# Patient Record
Sex: Male | Born: 1970 | Race: White | Hispanic: No | Marital: Married | Smoking: Never smoker
Health system: Southern US, Community
[De-identification: ages and names within clinical notes are randomized; demographics above are authoritative.]

## PROBLEM LIST (undated history)

## (undated) DIAGNOSIS — R112 Nausea with vomiting, unspecified: Secondary | ICD-10-CM

## (undated) DIAGNOSIS — M1990 Unspecified osteoarthritis, unspecified site: Secondary | ICD-10-CM

## (undated) DIAGNOSIS — I499 Cardiac arrhythmia, unspecified: Secondary | ICD-10-CM

## (undated) DIAGNOSIS — E785 Hyperlipidemia, unspecified: Secondary | ICD-10-CM

## (undated) DIAGNOSIS — G4730 Sleep apnea, unspecified: Secondary | ICD-10-CM

## (undated) DIAGNOSIS — Z9889 Other specified postprocedural states: Secondary | ICD-10-CM

## (undated) DIAGNOSIS — K649 Unspecified hemorrhoids: Secondary | ICD-10-CM

## (undated) DIAGNOSIS — I10 Essential (primary) hypertension: Secondary | ICD-10-CM

## (undated) DIAGNOSIS — R7989 Other specified abnormal findings of blood chemistry: Secondary | ICD-10-CM

## (undated) DIAGNOSIS — K219 Gastro-esophageal reflux disease without esophagitis: Secondary | ICD-10-CM

## (undated) DIAGNOSIS — M48061 Spinal stenosis, lumbar region without neurogenic claudication: Secondary | ICD-10-CM

## (undated) DIAGNOSIS — R0789 Other chest pain: Secondary | ICD-10-CM

## (undated) HISTORY — PX: BICEPS TENDON REPAIR: SHX566

## (undated) HISTORY — PX: BACK SURGERY: SHX140

## (undated) HISTORY — PX: OTHER SURGICAL HISTORY: SHX169

## (undated) HISTORY — PX: WISDOM TOOTH EXTRACTION: SHX21

## (undated) HISTORY — PX: HERNIA REPAIR: SHX51

## (undated) HISTORY — PX: MASTOIDECTOMY: SUR855

---

## 2001-01-10 ENCOUNTER — Encounter: Payer: Self-pay | Admitting: Neurosurgery

## 2001-01-13 ENCOUNTER — Inpatient Hospital Stay (HOSPITAL_COMMUNITY): Admission: RE | Admit: 2001-01-13 | Discharge: 2001-01-16 | Payer: Self-pay | Admitting: Neurosurgery

## 2001-01-13 ENCOUNTER — Encounter: Payer: Self-pay | Admitting: Neurosurgery

## 2001-03-08 ENCOUNTER — Encounter: Payer: Self-pay | Admitting: Neurosurgery

## 2001-03-08 ENCOUNTER — Ambulatory Visit (HOSPITAL_COMMUNITY): Admission: RE | Admit: 2001-03-08 | Discharge: 2001-03-08 | Payer: Self-pay | Admitting: Neurosurgery

## 2001-04-19 ENCOUNTER — Encounter: Payer: Self-pay | Admitting: Neurosurgery

## 2001-04-19 ENCOUNTER — Ambulatory Visit (HOSPITAL_COMMUNITY): Admission: RE | Admit: 2001-04-19 | Discharge: 2001-04-19 | Payer: Self-pay | Admitting: Neurosurgery

## 2001-07-08 ENCOUNTER — Encounter: Payer: Self-pay | Admitting: Neurosurgery

## 2001-07-08 ENCOUNTER — Encounter: Admission: RE | Admit: 2001-07-08 | Discharge: 2001-07-08 | Payer: Self-pay | Admitting: Neurosurgery

## 2001-12-22 ENCOUNTER — Encounter: Admission: RE | Admit: 2001-12-22 | Discharge: 2001-12-22 | Payer: Self-pay | Admitting: Neurosurgery

## 2001-12-22 ENCOUNTER — Encounter: Payer: Self-pay | Admitting: Neurosurgery

## 2004-10-31 ENCOUNTER — Ambulatory Visit: Payer: Self-pay

## 2005-07-02 ENCOUNTER — Ambulatory Visit: Payer: Self-pay | Admitting: Internal Medicine

## 2006-01-10 ENCOUNTER — Other Ambulatory Visit: Payer: Self-pay

## 2006-01-10 ENCOUNTER — Emergency Department: Payer: Self-pay

## 2006-01-22 ENCOUNTER — Ambulatory Visit: Payer: Self-pay | Admitting: Internal Medicine

## 2006-02-12 ENCOUNTER — Ambulatory Visit: Payer: Self-pay | Admitting: Surgery

## 2006-02-26 ENCOUNTER — Ambulatory Visit: Payer: Self-pay | Admitting: Surgery

## 2008-12-28 ENCOUNTER — Ambulatory Visit: Payer: Self-pay | Admitting: Neurosurgery

## 2010-06-30 ENCOUNTER — Ambulatory Visit: Payer: Self-pay | Admitting: Internal Medicine

## 2010-07-28 ENCOUNTER — Ambulatory Visit: Payer: Self-pay | Admitting: Internal Medicine

## 2015-11-29 ENCOUNTER — Ambulatory Visit: Payer: BC Managed Care – PPO

## 2016-02-28 ENCOUNTER — Ambulatory Visit: Payer: BC Managed Care – PPO | Attending: Specialist

## 2016-02-28 DIAGNOSIS — G4710 Hypersomnia, unspecified: Secondary | ICD-10-CM | POA: Insufficient documentation

## 2016-02-28 DIAGNOSIS — G4761 Periodic limb movement disorder: Secondary | ICD-10-CM | POA: Insufficient documentation

## 2016-02-28 DIAGNOSIS — G4733 Obstructive sleep apnea (adult) (pediatric): Secondary | ICD-10-CM | POA: Insufficient documentation

## 2017-01-20 ENCOUNTER — Other Ambulatory Visit: Payer: Self-pay | Admitting: Student

## 2017-01-20 DIAGNOSIS — M5416 Radiculopathy, lumbar region: Secondary | ICD-10-CM

## 2017-01-28 ENCOUNTER — Ambulatory Visit
Admission: RE | Admit: 2017-01-28 | Discharge: 2017-01-28 | Disposition: A | Discharge status: Home / Self Care | Payer: BC Managed Care – PPO | Source: Ambulatory Visit | Attending: Student | Admitting: Student

## 2017-01-28 DIAGNOSIS — M5416 Radiculopathy, lumbar region: Secondary | ICD-10-CM

## 2017-01-28 MED ORDER — GADOBENATE DIMEGLUMINE 529 MG/ML IV SOLN
20.0000 mL | Freq: Once | INTRAVENOUS | Status: AC | PRN
  Administered 2017-01-28: 20 mL via INTRAVENOUS

## 2017-02-02 ENCOUNTER — Other Ambulatory Visit: Payer: Self-pay | Admitting: Neurosurgery

## 2017-02-17 ENCOUNTER — Inpatient Hospital Stay (HOSPITAL_COMMUNITY): Admission: RE | Admit: 2017-02-17 | Payer: BC Managed Care – PPO | Source: Ambulatory Visit

## 2017-02-24 ENCOUNTER — Encounter (HOSPITAL_COMMUNITY): Admission: RE | Payer: Self-pay | Source: Ambulatory Visit

## 2017-02-24 ENCOUNTER — Inpatient Hospital Stay (HOSPITAL_COMMUNITY): Admission: RE | Admit: 2017-02-24 | Payer: BC Managed Care – PPO | Source: Ambulatory Visit | Admitting: Neurosurgery

## 2017-02-24 SURGERY — POSTERIOR LUMBAR FUSION 1 LEVEL
Anesthesia: General | Site: Back | Laterality: Bilateral

## 2017-03-12 ENCOUNTER — Other Ambulatory Visit: Payer: Self-pay | Admitting: Neurosurgery

## 2017-03-16 NOTE — Pre-Procedure Instructions (Signed)
Andrew Rojas  03/16/2017      [id number] [address], [place] [address] Phone: [phone] Fax: [phone]    Your procedure is scheduled on March 8  Report to [place] at 1100 A.M.  Call this number if you have problems the morning of surgery:  [phone]   Remember:  Do not eat food or drink liquids after midnight.  Take these medicines the morning of surgery with A SIP OF WATER Clomiphene (Clomid), Flonase spray, metoprolol succinate (Toprol-XL)  Stop taking aspirin, BC's, Goody's, Vitamins, Herbal medications, Fish Oil, Aleve, Naproxen,  Ibuprofen, Advil, Motrin    Do not wear jewelry, make-up or nail polish.  Do not wear lotions, powders, or perfumes, or deodorant.  Do not shave 48 hours prior to surgery.  Men may shave face and neck.  Do not bring valuables to the hospital.  [facility] is not responsible for any belongings or valuables.  Contacts, dentures or bridgework may not be worn into surgery.  Leave your suitcase in the car.  After surgery it may be brought to your room.  For patients admitted to the hospital, discharge time will be determined by your treatment team.  Patients discharged the day of surgery will not be allowed to drive home.   Special instructions:   [place]- Preparing For Surgery  Before surgery, you can play an important role. Because skin is not sterile, your skin needs to be as free of germs as possible. You can reduce the number of germs on your skin by washing with CHG (chlorahexidine gluconate) Soap before surgery.  CHG is an antiseptic cleaner which kills germs and bonds with the skin to continue killing germs even after washing.  Please do not use if you have an allergy to CHG or antibacterial soaps. If your skin becomes reddened/irritated stop using the CHG.  Do not shave (including legs and underarms) for at least 48 hours prior to first CHG shower.  It is OK to shave your face.  Please follow these instructions carefully.   1. Shower the NIGHT BEFORE SURGERY and the MORNING OF SURGERY with CHG.   2. If you chose to wash your hair, wash your hair first as usual with your normal shampoo.  3. After you shampoo, rinse your hair and body thoroughly to remove the shampoo.  4. Use CHG as you would any other liquid soap. You can apply CHG directly to the skin and wash gently with a scrungie or a clean washcloth.   5. Apply the CHG Soap to your body ONLY FROM THE NECK DOWN.  Do not use on open wounds or open sores. Avoid contact with your eyes, ears, mouth and genitals (private parts). Wash Face and genitals (private parts)  with your normal soap.  6. Wash thoroughly, paying special attention to the area where your surgery will be performed.  7. Thoroughly rinse your body with warm water from the neck down.  8. DO NOT shower/wash with your normal soap after using and rinsing off the CHG Soap.  9. Pat yourself dry with a CLEAN TOWEL.  10. Wear CLEAN PAJAMAS to bed the night before surgery, wear comfortable clothes the morning of surgery  11. Place CLEAN SHEETS on your bed the night of your first shower and DO NOT SLEEP WITH PETS.    Day of Surgery: Do not apply any deodorants/lotions. Please wear clean clothes to the  hospital/surgery center.      Please read over the following fact sheets that you were given. Pain Booklet, Coughing and Deep Breathing, MRSA Information and Surgical Site Infection Prevention

## 2017-03-17 ENCOUNTER — Encounter (HOSPITAL_COMMUNITY)
Admission: RE | Admit: 2017-03-17 | Discharge: 2017-03-17 | Disposition: A | Discharge status: Home / Self Care | Payer: BC Managed Care – PPO | Source: Ambulatory Visit | Attending: Neurosurgery | Admitting: Neurosurgery

## 2017-03-17 ENCOUNTER — Other Ambulatory Visit: Payer: Self-pay

## 2017-03-17 ENCOUNTER — Encounter (HOSPITAL_COMMUNITY): Payer: Self-pay

## 2017-03-17 HISTORY — DX: Essential (primary) hypertension: I10

## 2017-03-17 HISTORY — DX: Sleep apnea, unspecified: G47.30

## 2017-03-17 HISTORY — DX: Other specified postprocedural states: R11.2

## 2017-03-17 HISTORY — DX: Unspecified osteoarthritis, unspecified site: M19.90

## 2017-03-17 HISTORY — DX: Other specified postprocedural states: Z98.890

## 2017-03-17 LAB — CBC
HEMATOCRIT: 51.9 % (ref 39.0–52.0)
Hemoglobin: 18.3 g/dL — ABNORMAL HIGH (ref 13.0–17.0)
MCH: 32.9 pg (ref 26.0–34.0)
MCHC: 35.3 g/dL (ref 30.0–36.0)
MCV: 93.3 fL (ref 78.0–100.0)
PLATELETS: 204 10*3/uL (ref 150–400)
RBC: 5.56 MIL/uL (ref 4.22–5.81)
RDW: 13.5 % (ref 11.5–15.5)
WBC: 11.6 10*3/uL — AB (ref 4.0–10.5)

## 2017-03-17 LAB — SURGICAL PCR SCREEN
MRSA, PCR: NEGATIVE
STAPHYLOCOCCUS AUREUS: NEGATIVE

## 2017-03-17 LAB — BASIC METABOLIC PANEL
Anion gap: 13 (ref 5–15)
BUN: 9 mg/dL (ref 6–20)
CO2: 22 mmol/L (ref 22–32)
CREATININE: 1.08 mg/dL (ref 0.61–1.24)
Calcium: 9.3 mg/dL (ref 8.9–10.3)
Chloride: 106 mmol/L (ref 101–111)
GFR calc Af Amer: >60 mL/min (ref >60)
GLUCOSE: 91 mg/dL (ref 65–99)
Potassium: 4 mmol/L (ref 3.5–5.1)
SODIUM: 141 mmol/L (ref 135–145)

## 2017-03-17 LAB — TYPE AND SCREEN
ABO/RH(D): O POS
ANTIBODY SCREEN: NEGATIVE

## 2017-03-17 LAB — ABO/RH: ABO/RH(D): O POS

## 2017-03-17 NOTE — Progress Notes (Addendum)
PCP - [place] Reek Cardiologist - denies  Chest x-ray - not needed EKG - requesting Stress Test - > 5 years ECHO - denies Cardiac Cath - deneis  Sleep study 03/12/16 Wears at night  Anesthesia review: yes requested EKG  Patient denies shortness of breath, fever, cough and chest pain at PAT appointment   Patient verbalized understanding of instructions that were given to them at the PAT appointment. Patient was also instructed that they will need to review over the PAT instructions again at home before surgery.

## 2017-03-18 MED ORDER — DEXTROSE 5 % IV SOLN
3.0000 g | INTRAVENOUS | Status: AC
  Administered 2017-03-19: 3 g via INTRAVENOUS
  Filled 2017-03-18: qty 3

## 2017-03-19 ENCOUNTER — Encounter (HOSPITAL_COMMUNITY): Payer: Self-pay

## 2017-03-19 ENCOUNTER — Inpatient Hospital Stay (HOSPITAL_COMMUNITY): Payer: BC Managed Care – PPO

## 2017-03-19 ENCOUNTER — Inpatient Hospital Stay (HOSPITAL_COMMUNITY): Payer: BC Managed Care – PPO | Admitting: Anesthesiology

## 2017-03-19 ENCOUNTER — Inpatient Hospital Stay (HOSPITAL_COMMUNITY): Payer: BC Managed Care – PPO | Admitting: Vascular Surgery

## 2017-03-19 ENCOUNTER — Other Ambulatory Visit: Payer: Self-pay

## 2017-03-19 ENCOUNTER — Inpatient Hospital Stay (HOSPITAL_COMMUNITY)
Admission: RE | Admit: 2017-03-19 | Discharge: 2017-03-20 | DRG: 455 | Disposition: A | Discharge status: Home / Self Care | Payer: BC Managed Care – PPO | Source: Ambulatory Visit | Attending: Neurosurgery | Admitting: Neurosurgery

## 2017-03-19 ENCOUNTER — Inpatient Hospital Stay (HOSPITAL_COMMUNITY)
Admission: RE | Disposition: A | Discharge status: Home / Self Care | Payer: Self-pay | Source: Ambulatory Visit | Attending: Neurosurgery

## 2017-03-19 DIAGNOSIS — Z79899 Other long term (current) drug therapy: Secondary | ICD-10-CM

## 2017-03-19 DIAGNOSIS — I10 Essential (primary) hypertension: Secondary | ICD-10-CM | POA: Diagnosis present

## 2017-03-19 DIAGNOSIS — Z87891 Personal history of nicotine dependence: Secondary | ICD-10-CM

## 2017-03-19 DIAGNOSIS — M5126 Other intervertebral disc displacement, lumbar region: Secondary | ICD-10-CM | POA: Diagnosis present

## 2017-03-19 DIAGNOSIS — G4730 Sleep apnea, unspecified: Secondary | ICD-10-CM | POA: Diagnosis present

## 2017-03-19 DIAGNOSIS — Z7951 Long term (current) use of inhaled steroids: Secondary | ICD-10-CM

## 2017-03-19 DIAGNOSIS — Z419 Encounter for procedure for purposes other than remedying health state, unspecified: Secondary | ICD-10-CM

## 2017-03-19 HISTORY — DX: Spinal stenosis, lumbar region without neurogenic claudication: M48.061

## 2017-03-19 LAB — POCT I-STAT 4, (NA,K, GLUC, HGB,HCT)
Glucose, Bld: 121 mg/dL — ABNORMAL HIGH (ref 65–99)
HCT: 45 % (ref 39.0–52.0)
HEMOGLOBIN: 15.3 g/dL (ref 13.0–17.0)
Potassium: 4.2 mmol/L (ref 3.5–5.1)
SODIUM: 141 mmol/L (ref 135–145)

## 2017-03-19 SURGERY — POSTERIOR LUMBAR FUSION 1 WITH HARDWARE REMOVAL
Anesthesia: General | Site: Spine Lumbar

## 2017-03-19 MED ORDER — SCOPOLAMINE 1 MG/3DAYS TD PT72
MEDICATED_PATCH | TRANSDERMAL | Status: DC | PRN
  Administered 2017-03-19: 1 via TRANSDERMAL

## 2017-03-19 MED ORDER — CLOMIPHENE CITRATE 50 MG PO TABS
50.0000 mg | ORAL_TABLET | Freq: Every day | ORAL | Status: DC
  Filled 2017-03-19: qty 1

## 2017-03-19 MED ORDER — CYCLOBENZAPRINE HCL 10 MG PO TABS: 10.0000 mg | ORAL_TABLET | Freq: Three times a day (TID) | ORAL | Status: DC | PRN

## 2017-03-19 MED ORDER — OXYCODONE HCL 5 MG/5ML PO SOLN: 5.0000 mg | Freq: Once | ORAL | Status: DC | PRN

## 2017-03-19 MED ORDER — 0.9 % SODIUM CHLORIDE (POUR BTL) OPTIME
TOPICAL | Status: DC | PRN
  Administered 2017-03-19: 1000 mL

## 2017-03-19 MED ORDER — LIDOCAINE-EPINEPHRINE 1 %-1:100000 IJ SOLN
INTRAMUSCULAR | Status: AC
  Filled 2017-03-19: qty 1

## 2017-03-19 MED ORDER — NAPROXEN SODIUM 220 MG PO TABS: 440.0000 mg | ORAL_TABLET | Freq: Two times a day (BID) | ORAL | Status: DC | PRN

## 2017-03-19 MED ORDER — BUPIVACAINE LIPOSOME 1.3 % IJ SUSP
20.0000 mL | Freq: Once | INTRAMUSCULAR | Status: DC
  Filled 2017-03-19: qty 20

## 2017-03-19 MED ORDER — IBUPROFEN 200 MG PO TABS: 800.0000 mg | ORAL_TABLET | Freq: Four times a day (QID) | ORAL | Status: DC | PRN

## 2017-03-19 MED ORDER — SODIUM CHLORIDE 0.9 % IV SOLN: 250.0000 mL | INTRAVENOUS | Status: DC

## 2017-03-19 MED ORDER — VANCOMYCIN HCL 1000 MG IV SOLR
INTRAVENOUS | Status: AC
  Filled 2017-03-19: qty 1000

## 2017-03-19 MED ORDER — OXYCODONE HCL 5 MG PO TABS
10.0000 mg | ORAL_TABLET | ORAL | Status: DC | PRN
  Administered 2017-03-19 – 2017-03-20 (×3): 10 mg via ORAL
  Filled 2017-03-19 (×4): qty 2

## 2017-03-19 MED ORDER — FENTANYL CITRATE (PF) 250 MCG/5ML IJ SOLN
INTRAMUSCULAR | Status: AC
  Filled 2017-03-19: qty 5

## 2017-03-19 MED ORDER — THROMBIN 20000 UNITS EX SOLR
CUTANEOUS | Status: AC
  Filled 2017-03-19: qty 20000

## 2017-03-19 MED ORDER — LOSARTAN POTASSIUM 50 MG PO TABS
100.0000 mg | ORAL_TABLET | Freq: Every day | ORAL | Status: DC
  Administered 2017-03-19 – 2017-03-20 (×2): 100 mg via ORAL
  Filled 2017-03-19 (×2): qty 2

## 2017-03-19 MED ORDER — HYDROMORPHONE HCL 1 MG/ML IJ SOLN: 0.2500 mg | INTRAMUSCULAR | Status: DC | PRN

## 2017-03-19 MED ORDER — PANTOPRAZOLE SODIUM 40 MG IV SOLR
40.0000 mg | Freq: Every day | INTRAVENOUS | Status: DC
  Administered 2017-03-19: 40 mg via INTRAVENOUS
  Filled 2017-03-19: qty 40

## 2017-03-19 MED ORDER — ONDANSETRON HCL 4 MG/2ML IJ SOLN: 4.0000 mg | Freq: Four times a day (QID) | INTRAMUSCULAR | Status: DC | PRN

## 2017-03-19 MED ORDER — DEXMEDETOMIDINE HCL IN NACL 200 MCG/50ML IV SOLN
INTRAVENOUS | Status: DC | PRN
  Administered 2017-03-19: 24 ug via INTRAVENOUS

## 2017-03-19 MED ORDER — DEXAMETHASONE SODIUM PHOSPHATE 10 MG/ML IJ SOLN
10.0000 mg | INTRAMUSCULAR | Status: AC
  Administered 2017-03-19: 10 mg via INTRAVENOUS

## 2017-03-19 MED ORDER — LIDOCAINE HCL (CARDIAC) 20 MG/ML IV SOLN
INTRAVENOUS | Status: DC | PRN
  Administered 2017-03-19: 75 mg via INTRAVENOUS

## 2017-03-19 MED ORDER — ROCURONIUM BROMIDE 100 MG/10ML IV SOLN
INTRAVENOUS | Status: DC | PRN
  Administered 2017-03-19 (×4): 50 mg via INTRAVENOUS

## 2017-03-19 MED ORDER — ONDANSETRON HCL 4 MG PO TABS: 4.0000 mg | ORAL_TABLET | Freq: Four times a day (QID) | ORAL | Status: DC | PRN

## 2017-03-19 MED ORDER — OXYCODONE HCL 5 MG PO TABS: 5.0000 mg | ORAL_TABLET | Freq: Once | ORAL | Status: DC | PRN

## 2017-03-19 MED ORDER — PHENOL 1.4 % MT LIQD
1.0000 | OROMUCOSAL | Status: DC | PRN
  Administered 2017-03-19: 1 via OROMUCOSAL
  Filled 2017-03-19: qty 177

## 2017-03-19 MED ORDER — HYDROMORPHONE HCL 1 MG/ML IJ SOLN
1.0000 mg | INTRAMUSCULAR | Status: DC | PRN
Start: 2017-03-19 — End: 2017-03-20

## 2017-03-19 MED ORDER — SCOPOLAMINE 1 MG/3DAYS TD PT72
MEDICATED_PATCH | TRANSDERMAL | Status: AC
  Filled 2017-03-19: qty 1

## 2017-03-19 MED ORDER — ACETAMINOPHEN 325 MG PO TABS: 650.0000 mg | ORAL_TABLET | ORAL | Status: DC | PRN

## 2017-03-19 MED ORDER — LACTATED RINGERS IV SOLN
INTRAVENOUS | Status: DC
  Administered 2017-03-19 (×4): via INTRAVENOUS

## 2017-03-19 MED ORDER — ALBUMIN HUMAN 5 % IV SOLN
INTRAVENOUS | Status: DC | PRN
  Administered 2017-03-19 (×3): via INTRAVENOUS

## 2017-03-19 MED ORDER — PROPOFOL 10 MG/ML IV BOLUS
INTRAVENOUS | Status: DC | PRN
  Administered 2017-03-19: 300 mg via INTRAVENOUS
  Administered 2017-03-19: 40 mg via INTRAVENOUS

## 2017-03-19 MED ORDER — MENTHOL 3 MG MT LOZG: 1.0000 | LOZENGE | OROMUCOSAL | Status: DC | PRN

## 2017-03-19 MED ORDER — ADULT MULTIVITAMIN W/MINERALS CH
1.0000 | ORAL_TABLET | Freq: Every day | ORAL | Status: DC
  Administered 2017-03-19 – 2017-03-20 (×2): 1 via ORAL
  Filled 2017-03-19 (×2): qty 1

## 2017-03-19 MED ORDER — PSEUDOEPHEDRINE-GUAIFENESIN ER 60-600 MG PO TB12: 1.0000 | ORAL_TABLET | Freq: Every day | ORAL | Status: DC | PRN

## 2017-03-19 MED ORDER — CEFAZOLIN SODIUM-DEXTROSE 2-4 GM/100ML-% IV SOLN
2.0000 g | Freq: Three times a day (TID) | INTRAVENOUS | Status: DC
  Administered 2017-03-20: 2 g via INTRAVENOUS
  Filled 2017-03-19 (×4): qty 100

## 2017-03-19 MED ORDER — SODIUM CHLORIDE 0.9% FLUSH: 3.0000 mL | INTRAVENOUS | Status: DC | PRN

## 2017-03-19 MED ORDER — BUPIVACAINE LIPOSOME 1.3 % IJ SUSP
INTRAMUSCULAR | Status: DC | PRN
  Administered 2017-03-19: 20 mL

## 2017-03-19 MED ORDER — SODIUM CHLORIDE 0.9% FLUSH
3.0000 mL | Freq: Two times a day (BID) | INTRAVENOUS | Status: DC
  Administered 2017-03-19: 3 mL via INTRAVENOUS

## 2017-03-19 MED ORDER — SUGAMMADEX SODIUM 500 MG/5ML IV SOLN
INTRAVENOUS | Status: DC | PRN
Start: 2017-03-19 — End: 2017-03-19
  Administered 2017-03-19: 260 mg via INTRAVENOUS

## 2017-03-19 MED ORDER — VANCOMYCIN HCL 1000 MG IV SOLR
INTRAVENOUS | Status: DC | PRN
  Administered 2017-03-19: 1000 mg via TOPICAL

## 2017-03-19 MED ORDER — REWETTING DROPS SOLN: 1.0000 [drp] | Freq: Every day | Status: DC | PRN

## 2017-03-19 MED ORDER — LIDOCAINE-EPINEPHRINE 1 %-1:100000 IJ SOLN
INTRAMUSCULAR | Status: DC | PRN
  Administered 2017-03-19: 10 mL

## 2017-03-19 MED ORDER — FLUTICASONE PROPIONATE 50 MCG/ACT NA SUSP
2.0000 | Freq: Every day | NASAL | Status: DC
  Filled 2017-03-19: qty 16

## 2017-03-19 MED ORDER — CHLORHEXIDINE GLUCONATE CLOTH 2 % EX PADS: 6.0000 | MEDICATED_PAD | Freq: Once | CUTANEOUS | Status: DC

## 2017-03-19 MED ORDER — DM-GUAIFENESIN ER 30-600 MG PO TB12: 1.0000 | ORAL_TABLET | Freq: Every day | ORAL | Status: DC | PRN

## 2017-03-19 MED ORDER — MIDAZOLAM HCL 2 MG/2ML IJ SOLN
INTRAMUSCULAR | Status: AC
  Filled 2017-03-19: qty 2

## 2017-03-19 MED ORDER — THROMBIN (RECOMBINANT) 20000 UNITS EX SOLR
CUTANEOUS | Status: DC | PRN
  Administered 2017-03-19 (×2): 20 mL via TOPICAL

## 2017-03-19 MED ORDER — METOPROLOL SUCCINATE ER 25 MG PO TB24
50.0000 mg | ORAL_TABLET | Freq: Every day | ORAL | Status: DC
  Administered 2017-03-20: 50 mg via ORAL
  Filled 2017-03-19: qty 2

## 2017-03-19 MED ORDER — DEXAMETHASONE SODIUM PHOSPHATE 10 MG/ML IJ SOLN
INTRAMUSCULAR | Status: AC
  Filled 2017-03-19: qty 1

## 2017-03-19 MED ORDER — ALUM & MAG HYDROXIDE-SIMETH 200-200-20 MG/5ML PO SUSP: 30.0000 mL | Freq: Four times a day (QID) | ORAL | Status: DC | PRN

## 2017-03-19 MED ORDER — BACITRACIN 50000 UNITS IM SOLR
INTRAMUSCULAR | Status: DC | PRN
  Administered 2017-03-19: 500 mL

## 2017-03-19 MED ORDER — ONDANSETRON HCL 4 MG/2ML IJ SOLN
INTRAMUSCULAR | Status: DC | PRN
  Administered 2017-03-19: 4 mg via INTRAVENOUS

## 2017-03-19 MED ORDER — PHENYLEPHRINE HCL 0.25 % NA SOLN: 1.0000 | Freq: Four times a day (QID) | NASAL | Status: DC | PRN

## 2017-03-19 MED ORDER — MIDAZOLAM HCL 5 MG/5ML IJ SOLN
INTRAMUSCULAR | Status: DC | PRN
  Administered 2017-03-19: 2 mg via INTRAVENOUS

## 2017-03-19 MED ORDER — THROMBIN 5000 UNITS EX SOLR
CUTANEOUS | Status: AC
  Filled 2017-03-19: qty 5000

## 2017-03-19 MED ORDER — ACETAMINOPHEN 650 MG RE SUPP: 650.0000 mg | RECTAL | Status: DC | PRN

## 2017-03-19 MED ORDER — FENTANYL CITRATE (PF) 100 MCG/2ML IJ SOLN
INTRAMUSCULAR | Status: DC | PRN
  Administered 2017-03-19 (×4): 50 ug via INTRAVENOUS
  Administered 2017-03-19: 150 ug via INTRAVENOUS
  Administered 2017-03-19 (×2): 50 ug via INTRAVENOUS

## 2017-03-19 MED ORDER — EMERGEN-C IMMUNE PO PACK: 1.0000 | PACK | Freq: Every day | ORAL | Status: DC | PRN

## 2017-03-19 SURGICAL SUPPLY — 84 items
ADH SKN CLS APL DERMABOND .7 (GAUZE/BANDAGES/DRESSINGS) ×1
APL SKNCLS STERI-STRIP NONHPOA (GAUZE/BANDAGES/DRESSINGS) ×1
BAG DECANTER FOR FLEXI CONT (MISCELLANEOUS) ×3 IMPLANT
BASKET BONE COLLECTION (BASKET) ×2 IMPLANT
BENZOIN TINCTURE PRP APPL 2/3 (GAUZE/BANDAGES/DRESSINGS) ×3 IMPLANT
BLADE CLIPPER SURG (BLADE) ×2 IMPLANT
BLADE SURG 11 STRL SS (BLADE) ×3 IMPLANT
BONE VIVIGEN FORMABLE 5.4CC (Bone Implant) ×3 IMPLANT
BUR CUTTER 7.0 ROUND (BURR) ×3 IMPLANT
BUR MATCHSTICK NEURO 3.0 LAGG (BURR) ×3 IMPLANT
CANISTER SUCT 3000ML PPV (MISCELLANEOUS) ×3 IMPLANT
CAP LOCKING THREADED (Cap) ×4 IMPLANT
CARTRIDGE OIL MAESTRO DRILL (MISCELLANEOUS) ×1 IMPLANT
CLOSURE WOUND 1/2 X4 (GAUZE/BANDAGES/DRESSINGS) ×1
CONT SPEC 4OZ CLIKSEAL STRL BL (MISCELLANEOUS) ×3 IMPLANT
COVER BACK TABLE 24X17X13 BIG (DRAPES) IMPLANT
COVER BACK TABLE 60X90IN (DRAPES) ×3 IMPLANT
DECANTER SPIKE VIAL GLASS SM (MISCELLANEOUS) ×3 IMPLANT
DERMABOND ADVANCED (GAUZE/BANDAGES/DRESSINGS) ×2
DERMABOND ADVANCED .7 DNX12 (GAUZE/BANDAGES/DRESSINGS) ×1 IMPLANT
DIFFUSER DRILL AIR PNEUMATIC (MISCELLANEOUS) ×3 IMPLANT
DRAPE C-ARM 42X72 X-RAY (DRAPES) ×6 IMPLANT
DRAPE C-ARMOR (DRAPES) IMPLANT
DRAPE HALF SHEET 40X57 (DRAPES) ×2 IMPLANT
DRAPE LAPAROTOMY 100X72X124 (DRAPES) ×3 IMPLANT
DRAPE POUCH INSTRU U-SHP 10X18 (DRAPES) ×3 IMPLANT
DRAPE SURG 17X23 STRL (DRAPES) ×3 IMPLANT
DRSG OPSITE POSTOP 4X8 (GAUZE/BANDAGES/DRESSINGS) ×2 IMPLANT
DURAPREP 26ML APPLICATOR (WOUND CARE) ×3 IMPLANT
ELECT BLADE 4.0 EZ CLEAN MEGAD (MISCELLANEOUS) ×3
ELECT REM PT RETURN 9FT ADLT (ELECTROSURGICAL) ×3
ELECTRODE BLDE 4.0 EZ CLN MEGD (MISCELLANEOUS) IMPLANT
ELECTRODE REM PT RTRN 9FT ADLT (ELECTROSURGICAL) ×1 IMPLANT
EVACUATOR 3/16  PVC DRAIN (DRAIN)
EVACUATOR 3/16 PVC DRAIN (DRAIN) ×1 IMPLANT
GAUZE SPONGE 4X4 12PLY STRL (GAUZE/BANDAGES/DRESSINGS) ×1 IMPLANT
GAUZE SPONGE 4X4 16PLY XRAY LF (GAUZE/BANDAGES/DRESSINGS) ×2 IMPLANT
GLOVE BIO SURGEON STRL SZ7 (GLOVE) ×2 IMPLANT
GLOVE BIO SURGEON STRL SZ8 (GLOVE) ×6 IMPLANT
GLOVE BIOGEL PI IND STRL 6.5 (GLOVE) IMPLANT
GLOVE BIOGEL PI IND STRL 7.0 (GLOVE) IMPLANT
GLOVE BIOGEL PI INDICATOR 6.5 (GLOVE) ×2
GLOVE BIOGEL PI INDICATOR 7.0 (GLOVE) ×4
GLOVE ECLIPSE 7.5 STRL STRAW (GLOVE) IMPLANT
GLOVE INDICATOR 8.5 STRL (GLOVE) ×6 IMPLANT
GLOVE SS BIOGEL STRL SZ 7 (GLOVE) IMPLANT
GLOVE SUPERSENSE BIOGEL SZ 7 (GLOVE) ×2
GLOVE SURG SS PI 6.0 STRL IVOR (GLOVE) ×8 IMPLANT
GOWN STRL REUS W/ TWL LRG LVL3 (GOWN DISPOSABLE) IMPLANT
GOWN STRL REUS W/ TWL XL LVL3 (GOWN DISPOSABLE) ×2 IMPLANT
GOWN STRL REUS W/TWL 2XL LVL3 (GOWN DISPOSABLE) IMPLANT
GOWN STRL REUS W/TWL LRG LVL3 (GOWN DISPOSABLE) ×6
GOWN STRL REUS W/TWL XL LVL3 (GOWN DISPOSABLE) ×6
GRAFT BNE MATRIX VG FRMBL MD 5 (Bone Implant) IMPLANT
KIT BASIN OR (CUSTOM PROCEDURE TRAY) ×3 IMPLANT
KIT ROOM TURNOVER OR (KITS) ×3 IMPLANT
MILL MEDIUM DISP (BLADE) ×3 IMPLANT
NDL HYPO 21X1.5 SAFETY (NEEDLE) ×1 IMPLANT
NDL HYPO 25X1 1.5 SAFETY (NEEDLE) ×1 IMPLANT
NEEDLE HYPO 21X1.5 SAFETY (NEEDLE) ×3 IMPLANT
NEEDLE HYPO 25X1 1.5 SAFETY (NEEDLE) ×3 IMPLANT
NS IRRIG 1000ML POUR BTL (IV SOLUTION) ×3 IMPLANT
OIL CARTRIDGE MAESTRO DRILL (MISCELLANEOUS) ×3
PACK LAMINECTOMY NEURO (CUSTOM PROCEDURE TRAY) ×3 IMPLANT
PAD ARMBOARD 7.5X6 YLW CONV (MISCELLANEOUS) ×13 IMPLANT
PATTIES SURGICAL 1X1 (DISPOSABLE) ×2 IMPLANT
ROD CREO 50MM (Rod) ×4 IMPLANT
SCREW PA THRD CREO TULIP 5.5X4 (Head) ×4 IMPLANT
SCREW SET (Screw) ×4 IMPLANT
SCREW SPINE CREO AMP 6.5X50 (Screw) ×4 IMPLANT
SPACER RISE 10X30 10-17MM-15 (Spacer) ×4 IMPLANT
SPONGE LAP 4X18 X RAY DECT (DISPOSABLE) IMPLANT
SPONGE SURGIFOAM ABS GEL 100 (HEMOSTASIS) ×5 IMPLANT
STRIP CLOSURE SKIN 1/2X4 (GAUZE/BANDAGES/DRESSINGS) ×3 IMPLANT
SUT VIC AB 0 CT1 18XCR BRD8 (SUTURE) ×2 IMPLANT
SUT VIC AB 0 CT1 8-18 (SUTURE) ×6
SUT VIC AB 2-0 CT1 18 (SUTURE) ×3 IMPLANT
SUT VIC AB 4-0 PS2 27 (SUTURE) ×3 IMPLANT
SYR 20CC LL (SYRINGE) ×2 IMPLANT
SYR CONTROL 10ML LL (SYRINGE) ×2 IMPLANT
TOWEL GREEN STERILE (TOWEL DISPOSABLE) ×3 IMPLANT
TOWEL GREEN STERILE FF (TOWEL DISPOSABLE) ×3 IMPLANT
TRAY FOLEY W/METER SILVER 16FR (SET/KITS/TRAYS/PACK) ×3 IMPLANT
WATER STERILE IRR 1000ML POUR (IV SOLUTION) ×3 IMPLANT

## 2017-03-19 NOTE — H&P (Signed)
Andrew Rojas is an 47 y.o. male.   Chief Complaint: Back and bilateral leg pain HPI: A 48 year old gentleman with history of previous L4-5 fusion [place] back and bilateral hip and leg pain radiating and L3 nerve root pattern. Workup revealed segmental degeneration above his previous 45 fusion at L3-4. Due the patient's progression of clinical syndrome imaging findings Letta Median conservative treatment I recommended reinsertion of lumbar fusion removal of hardware and posterior lumbar interbody fusion at L3-4. I've extensively gone over the risks and benefits of that operation with him as well as perioperative course expectations of outcome and alternatives to surgery and he understood and agreed to proceed forward.  Past Medical History:  Diagnosis Date  . Arthritis    back  . Hypertension   . Lumbar stenosis   . PONV (postoperative nausea and vomiting)   . Sleep apnea    wears CPAP    Past Surgical History:  Procedure Laterality Date  . BACK SURGERY     PLIF  . BICEPS TENDON REPAIR Right   . HERNIA REPAIR    . MASTOIDECTOMY    . WISDOM TOOTH EXTRACTION      History reviewed. No pertinent family history. Social History:  reports that  has never smoked. He has quit using smokeless tobacco. He reports that he drinks alcohol. He reports that he does not use drugs.  Allergies: No Known Allergies  Medications Prior to Admission  Medication Sig Dispense Refill  . clomiPHENE (CLOMID) 50 MG tablet Take 50 mg by mouth daily.    . fluticasone (FLONASE) 50 MCG/ACT nasal spray Place 2 sprays into both nostrils daily.  11  . ibuprofen (ADVIL,MOTRIN) 200 MG tablet Take 800 mg by mouth every 6 (six) hours as needed (for pain.).    Marland Kitchen losartan (COZAAR) 100 MG tablet Take 100 mg by mouth daily.  3  . metoprolol succinate (TOPROL-XL) 50 MG 24 hr tablet Take 50 mg by mouth daily.  3  . Multiple Vitamin (MULTIVITAMIN WITH MINERALS) TABS tablet Take 1 tablet by mouth daily. Men's One A Day Multivitamin     . Multiple Vitamins-Minerals (EMERGEN-C IMMUNE) PACK Take 1 packet by mouth daily as needed (for immune health).    . naproxen sodium (ALEVE) 220 MG tablet Take 440 mg by mouth 2 (two) times daily as needed (for back and muscle pain.).    Marland Kitchen Omega-3 Fatty Acids (FISH OIL) 1000 MG CAPS Take 1,000 mg by mouth daily.    Marland Kitchen OVER THE COUNTER MEDICATION Take 1 capsule by mouth daily as needed (prior to workouts.). BCAA (BRANCHED-CHAIN AMINO ACID)    . phenylephrine (NEO-SYNEPHRINE) 0.25 % nasal spray Place 1 spray into both nostrils every 6 (six) hours as needed for congestion.    . pseudoephedrine-guaifenesin (MUCINEX D) 60-600 MG 12 hr tablet Take 1 tablet by mouth daily as needed (sinus issues/congestion.).    Marland Kitchen Soft Lens Products (REWETTING DROPS) SOLN Place 1-2 drops into both eyes daily as needed (for contact lenses.).      Results for orders placed or performed during the hospital encounter of 03/17/17 (from the past 48 hour(s))  Surgical pcr screen     Status: None   Collection Time: 03/17/17  3:39 PM  Result Value Ref Range   MRSA, PCR NEGATIVE NEGATIVE   Staphylococcus aureus NEGATIVE NEGATIVE    Comment: (NOTE) The Xpert SA Assay (FDA approved for NASAL specimens in patients 6 years of age and older), is one component of a comprehensive surveillance program.  It is not intended to diagnose infection nor to guide or monitor treatment. Performed at [facility], [place] [address] [place], [place] [address]   Basic metabolic panel     Status: None   Collection Time: 03/17/17  3:40 PM  Result Value Ref Range   Sodium 141 135 - 145 mmol/L   Potassium 4.0 3.5 - 5.1 mmol/L   Chloride 106 101 - 111 mmol/L   CO2 22 22 - 32 mmol/L   Glucose, Bld 91 65 - 99 mg/dL   BUN 9 6 - 20 mg/dL   Creatinine, Ser 1.08 0.61 - 1.24 mg/dL   Calcium 9.3 8.9 - 10.3 mg/dL   GFR calc non Af Amer >60 >60 mL/min   GFR calc Af Amer >60 >60 mL/min    Comment: (NOTE) The eGFR has been calculated using the  CKD EPI equation. This calculation has not been validated in all clinical situations. eGFR's persistently <60 mL/min signify possible Chronic Kidney Disease.    Anion gap 13 5 - 15    Comment: Performed at [facility] [address], [place], [place] [address]  CBC     Status: Abnormal   Collection Time: 03/17/17  3:40 PM  Result Value Ref Range   WBC 11.6 (H) 4.0 - 10.5 K/uL   RBC 5.56 4.22 - 5.81 MIL/uL   Hemoglobin 18.3 (H) 13.0 - 17.0 g/dL   HCT 51.9 39.0 - 52.0 %   MCV 93.3 78.0 - 100.0 fL   MCH 32.9 26.0 - 34.0 pg   MCHC 35.3 30.0 - 36.0 g/dL   RDW 13.5 11.5 - 15.5 %   Platelets 204 150 - 400 K/uL    Comment: Performed at [facility] [address] [place], [place] [address]  Type and screen All Cardiac and thoracic surgeries, spinal fusions, myomectomies, craniotomies, colon & liver resections, total joint revisions, same day c-section with placenta previa or accreta.     Status: None   Collection Time: 03/17/17  3:45 PM  Result Value Ref Range   ABO/RH(D) O POS    Antibody Screen NEG    Sample Expiration 03/31/2017    Extend sample reason      NO TRANSFUSIONS OR PREGNANCY IN THE PAST 3 MONTHS Performed at [facility], [place] [address], [place], [place] [address]   ABO/Rh     Status: None   Collection Time: 03/17/17  3:45 PM  Result Value Ref Range   ABO/RH(D)      O POS Performed at [facility] [address], [place],  [address]    No results found.  Review of Systems  Musculoskeletal: Positive for back pain and myalgias.  Neurological: Positive for sensory change.    Blood pressure (!) 145/88, pulse 97, temperature 97.7 F (36.5 C), temperature source Oral, resp. rate 20, SpO2 97 %. Physical Exam  Constitutional: He is oriented to person, place, and time. He appears well-developed.  HENT:  Head: Normocephalic.  Eyes: Pupils are equal, round, and reactive to light.  GI: Soft.  Neurological: He is alert and oriented to  person, place, and time. He has normal strength. GCS eye subscore is 4. GCS verbal subscore is 5. GCS motor subscore is 6.  Strength is 5 out of 5 iliopsoas, quads, hamstrings, gastric, and tibialis, and EHL.  Skin: Skin is warm and dry.     Assessment/Plan 46 presents for posterior lumbar interbody fusion L3-4  Janeth Terry P,  MD 03/19/2017, 1:01 PM

## 2017-03-19 NOTE — Op Note (Signed)
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  #2 decompressive lumbar limiting L3-4 and excess and requiring more work to would be needed with a standard interbody fusion with complete medial facetectomies radical foraminotomies of the L3 and L4 nerve roots.  #3 posterior lumbar interbody fusion L3-4 utilizing the globus rise expandable cage system with 10 x 17 expandable 30 mm cages bilaterally cages were packed with locally harvested autograft mixed with vivigen  #4 pedicle screw fixation L3-4 with globus Creole amp modular pedicle screws placed at L3 bilaterally with 6.5 x 50 mm screws tying into the CD Horizon M8 residual screws at L4.  #5 posterior lateral arthrodesis L3-4 utilizing locally harvested autograft mixed with DBX mix  Surgeon: Dominica Severin Roczen Waym[place]  Asst.: Glenford Peers  Anesthesia: Gen.  EBL: Minimal  History of present illness: 47 year old gentleman with a history of L4-5 fusion back in 2003 did very well however however over the last several months to suppress worsening back bilateral leg pain consistent with an L3 and L4 nerve root pattern. Workup revealed segment breakdown L3-4 with a disc herniation instability diastased of the facet joints. Due to patient's progression of clinical syndrome failed conservative treatment imaging findings I recommended decompression stabilization procedure at L3-4 with an exploration of fusion removal of hardware L4-5. I extensively reviewed the risks and benefits of the operation with the patient as well as perioperative course expectations of outcome alternatives of surgery and she understood and agreed to proceed forward  Operative procedure: 47 year old per the brought in the or was induced on general anesthesia positioned prone the Wilson frame his back  spelled prepped and draped in routine sterile fashion old incision was opened up and extended cephalad after infiltration of 10 mL lidocaine with epi a scar tissues dissected free exposing the old hardware at L4-5 the fusion did appear to be solid. I then dissected and exposed the TPS L3. I then disconnected across and disconnected the nuts and rods and removed the rods cross-link and L5 pedicle screws. Then removed the spinous process at L3 performed drill down the facet joints at L3-4 perform central decompression with complete medial facetectomies dissect into the scar tissue inferiorly and unroofing the 4 root distally. Marching superiorly I aggressively under bit the superior tickling facet marching out the L3 nerve root removing a large amount of hypertrophied facet joint and causing marked foraminal stenosis. At the end the foraminotomies all the nerve roots free and clear and unroofed I then worked in the discectomy. Epidural veins, the disc spaces cleanout bilaterally utilizing sequential distraction with an 11 distractor placed a selected 1017 expander cages. After adequate discectomy and endplate preparation I inserted the right-sided cage and expanded proximal 7 turns then removed the distractor posterior fluoroscopy confirmed good position of the implant. Cleaned out the disc both centrally and laterally was several large herniations 1 migrated inferiorly on the left remove this and after adequate endplate preparation I packed the locally harvested autograft mix centrally inserted the contralateral cage and expanded a similar fashion. Then the wound scope was irrigated meticulous in space was maintained all the foraminal reinspected to confirm patency and decompression. I then pedicle was drilled cannulated probed tapped probed and 6.5 x 50 mm screws were inserted bilaterally at L3. Posterior fluoroscopy confirmed good position of the implants was cut was irrigated meticulous hemostasis was maintained  aggressive decortication was care MTPs or lateral gutters lateral facet joints local autograft mix was packed posterior laterally then I simple the heads place the rods anchored everything in place reinspected the foramina to confirm patency blade Gelfoam on the dura injected X Brown the fashion sprinkled vancomycin powder in the wound. Then a large Hemovac drain was placed and the wounds closed in layers with interrupted Vicryl skin was closed running 4 subcuticular Dermabond benzo and Steri-Strips and sterile dressing was applied patient recovered in stable condition. At the end of case all needle counts sponge counts were correct.

## 2017-03-19 NOTE — Transfer of Care (Signed)
Immediate Anesthesia Transfer of Care Note  Patient: Andrew Rojas  Procedure(s) Performed: POSTERIOR LUMBAR INTERBODY FUSION LUMBAR THREE-FOUR (N/A Spine Lumbar)  Patient Location: PACU  Anesthesia Type:General  Level of Consciousness: awake and patient cooperative  Airway & Oxygen Therapy: Patient Spontanous Breathing  Post-op Assessment: Report given to RN and Post -op Vital signs reviewed and stable  Post vital signs: Reviewed and stable  Last Vitals:  Vitals:   03/19/17 1148 03/19/17 1253  BP: (!) 149/90 (!) 145/88  Pulse:    Resp:    Temp:    SpO2:      Last Pain:  Vitals:   03/19/17 1119  TempSrc: Oral      Patients Stated Pain Goal: 3 (11/65/79 0383)  Complications: No apparent anesthesia complications

## 2017-03-19 NOTE — Anesthesia Preprocedure Evaluation (Signed)
Anesthesia Evaluation  Patient identified by MRN, date of birth, ID band Patient awake    Reviewed: Allergy & Precautions, H&P , NPO status , Patient's Chart, lab work & pertinent test results  History of Anesthesia Complications (+) PONV and history of anesthetic complications  Airway Mallampati: II   Neck ROM: full    Dental   Pulmonary sleep apnea ,    breath sounds clear to auscultation       Cardiovascular hypertension,  Rhythm:regular Rate:Normal     Neuro/Psych    GI/Hepatic   Endo/Other    Renal/GU      Musculoskeletal  (+) Arthritis ,   Abdominal   Peds  Hematology   Anesthesia Other Findings   Reproductive/Obstetrics                             Anesthesia Physical Anesthesia Plan  ASA: III  Anesthesia Plan: General   Post-op Pain Management:    Induction: Intravenous  PONV Risk Score and Plan: 3 and Ondansetron, Dexamethasone, Midazolam, Treatment may vary due to age or medical condition and Scopolamine patch - Pre-op  Airway Management Planned: Oral ETT  Additional Equipment:   Intra-op Plan:   Post-operative Plan: Extubation in OR  Informed Consent: I have reviewed the patients History and Physical, chart, labs and discussed the procedure including the risks, benefits and alternatives for the proposed anesthesia with the patient or authorized representative who has indicated his/her understanding and acceptance.     Plan Discussed with: CRNA, Anesthesiologist and Surgeon  Anesthesia Plan Comments:         Anesthesia Quick Evaluation

## 2017-03-19 NOTE — Anesthesia Procedure Notes (Signed)
Procedure Name: Intubation Date/Time: 03/19/2017 1:30 PM Performed by: Lance Coon, CRNA Pre-anesthesia Checklist: Patient identified, Emergency Drugs available, Suction available, Patient being monitored and Timeout performed Patient Re-evaluated:Patient Re-evaluated prior to induction Oxygen Delivery Method: Circle system utilized Preoxygenation: Pre-oxygenation with 100% oxygen Induction Type: IV induction Ventilation: Oral airway inserted - appropriate to patient size and Mask ventilation without difficulty Laryngoscope Size: Miller and 3 Grade View: Grade I Tube type: Oral Number of attempts: 1 Placement Confirmation: ETT inserted through vocal cords under direct vision,  positive ETCO2,  CO2 detector and breath sounds checked- equal and bilateral Secured at: 22 cm Tube secured with: Tape Dental Injury: Teeth and Oropharynx as per pre-operative assessment

## 2017-03-20 MED ORDER — SENNA 8.6 MG PO TABS
1.0000 | ORAL_TABLET | Freq: Every day | ORAL | Status: DC
  Administered 2017-03-20: 8.6 mg via ORAL
  Filled 2017-03-20: qty 1

## 2017-03-20 MED ORDER — PANTOPRAZOLE SODIUM 40 MG PO TBEC: 40.0000 mg | DELAYED_RELEASE_TABLET | Freq: Every day | ORAL | Status: DC

## 2017-03-20 MED ORDER — OXYCODONE-ACETAMINOPHEN 7.5-325 MG PO TABS: 1.0000 | ORAL_TABLET | ORAL | 0 refills | Status: DC | PRN

## 2017-03-20 MED ORDER — DOCUSATE SODIUM 100 MG PO CAPS: 100.0000 mg | ORAL_CAPSULE | Freq: Every day | ORAL | Status: DC | PRN

## 2017-03-20 MED ORDER — CYCLOBENZAPRINE HCL 10 MG PO TABS: 10.0000 mg | ORAL_TABLET | Freq: Three times a day (TID) | ORAL | 2 refills | Status: DC | PRN

## 2017-03-20 NOTE — Progress Notes (Signed)
Pt given discharge instructions, IV and tele removed. Drain removed by Laverda Sorenson, RN. Questions answered regarding dressing changes and follow up appt. Prescriptions given. Wife at bedside. Taken out via wheelchair.

## 2017-03-20 NOTE — Discharge Summary (Signed)
Physician Discharge Summary  Patient ID: Andrew Rojas MRN: [id number] DOB/AGE: 08-09-1970 47 y.o.  Admit date: 03/19/2017 Discharge date: 03/20/2017  Admission Diagnoses:  Lumbar HNP  Discharge Diagnoses:  Same Active Problems:   HNP (herniated nucleus pulposus), lumbar   Discharged Condition: Stable  Hospital Course:  Andrew Rojas is a 47 y.o. male who was admitted for the below procedure. There were no post operative complications. At time of discharge, pain was well controlled, ambulating with Pt/OT, tolerating po, voiding normal. Ready for discharge.  Treatments: Surgery Exploration of fusion removal of hardware L4-5 with removal of bilateral L5 pedicle screws in addition to rods and nuts.  #2 decompressive lumbar limiting L3-4 and excess and requiring more work to would be needed with a standard interbody fusion with complete medial facetectomies radical foraminotomies of the L3 and L4 nerve roots.  #3 posterior lumbar interbody fusion L3-4 utilizing the globus rise expandable cage system with 10 x 17 expandable 30 mm cages bilaterally cages were packed with locally harvested autograft mixed with vivigen  #4 pedicle screw fixation L3-4 with globus Creole amp modular pedicle screws placed at L3 bilaterally with 6.5 x 50 mm screws tying into the CD Horizon M8 residual screws at L4.  #5 posterior lateral arthrodesis L3-4 utilizing locally harvested autograft mixed with DBX mix  Discharge Exam: Blood pressure 134/64, pulse 88, temperature 98.3 F (36.8 C), temperature source Oral, resp. rate 18, SpO2 99 %. Awake, alert, oriented Speech fluent, appropriate CN grossly intact 5/5 BUE/BLE Wound c/d/i  Disposition:   Discharge Instructions    Call MD for:  difficulty breathing, headache or visual disturbances   Complete by:  As directed    Call MD for:  persistant dizziness or light-headedness   Complete by:  As directed    Call MD for:  redness, tenderness, or signs of  infection (pain, swelling, redness, odor or green/yellow discharge around incision site)   Complete by:  As directed    Call MD for:  severe uncontrolled pain   Complete by:  As directed    Call MD for:  temperature >100.4   Complete by:  As directed    Diet general   Complete by:  As directed    Driving Restrictions   Complete by:  As directed    Do not drive until given clearance.   Increase activity slowly   Complete by:  As directed    Lifting restrictions   Complete by:  As directed    Do not lift anything >10lbs. Avoid bending and twisting in awkward positions. Avoid bending at the back.   May shower / Bathe   Complete by:  As directed    In 24 hours. Okay to wash wound with warm soapy water. Avoid scrubbing the wound. Pat dry.     Allergies as of 03/20/2017   No Known Allergies     Medication List    TAKE these medications   ALEVE 220 MG tablet Generic drug:  naproxen sodium Take 440 mg by mouth 2 (two) times daily as needed (for back and muscle pain.).   clomiPHENE 50 MG tablet Commonly known as:  CLOMID Take 50 mg by mouth daily.   cyclobenzaprine 10 MG tablet Commonly known as:  FLEXERIL Take 1 tablet (10 mg total) by mouth 3 (three) times daily as needed for muscle spasms.   EMERGEN-C IMMUNE Pack Take 1 packet by mouth daily as needed (for immune health).   Fish Oil 1000 MG Caps  Take 1,000 mg by mouth daily.   fluticasone 50 MCG/ACT nasal spray Commonly known as:  FLONASE Place 2 sprays into both nostrils daily.   ibuprofen 200 MG tablet Commonly known as:  ADVIL,MOTRIN Take 800 mg by mouth every 6 (six) hours as needed (for pain.).   losartan 100 MG tablet Commonly known as:  COZAAR Take 100 mg by mouth daily.   metoprolol succinate 50 MG 24 hr tablet Commonly known as:  TOPROL-XL Take 50 mg by mouth daily.   multivitamin with minerals Tabs tablet Take 1 tablet by mouth daily. Men's One A Day Multivitamin   OVER THE COUNTER MEDICATION Take 1  capsule by mouth daily as needed (prior to workouts.). BCAA (BRANCHED-CHAIN AMINO ACID)   oxyCODONE-acetaminophen 7.5-325 MG tablet Commonly known as:  PERCOCET Take 1 tablet by mouth every 4 (four) hours as needed for severe pain.   phenylephrine 0.25 % nasal spray Commonly known as:  NEO-SYNEPHRINE Place 1 spray into both nostrils every 6 (six) hours as needed for congestion.   pseudoephedrine-guaifenesin 60-600 MG 12 hr tablet Commonly known as:  MUCINEX D Take 1 tablet by mouth daily as needed (sinus issues/congestion.).   REWETTING DROPS Soln Place 1-2 drops into both eyes daily as needed (for contact lenses.).      Follow-up Information    Kary Kos, MD Follow up.   Specialty:  Neurosurgery Contact information: [address] [place] [address] [place] [address] [phone]           Signed: Traci Sermon 03/20/2017, 8:50 AM

## 2017-03-20 NOTE — Evaluation (Addendum)
Occupational Therapy Evaluation and Discharge Patient Details Name: Andrew Rojas MRN: [id number] DOB: 09-Aug-1970 Today's Date: 03/20/2017    History of Present Illness Andrew Rojas is a pleasant 47 y/o male admitted on 03/19/17 for L3-4 PLIF with L4-5 hardware removal. Patient with a PMH significant for HTN, lumbar stenosis, and sleep apnea.   Clinical Impression   PTA pt independent in ADL. Pt is currently mod I with compensatory strategies for ADL. Back handout provided and reviewed adls in detail. Pt educated on: clothing between brace, never sleep in brace, set an alarm at night for medication, avoid sitting for long periods of time, correct bed positioning for sleeping, correct sequence for bed mobility, avoiding lifting more than 5 pounds and never wash directly over incision. All education is complete and patient indicates understanding. OT to sign off at this time.      Follow Up Recommendations  No OT follow up;Supervision - Intermittent    Equipment Recommendations  3 in 1 bedside commode    Recommendations for Other Services       Precautions / Restrictions Precautions Precautions: Back;Fall Precaution Booklet Issued: Yes (comment) Precaution Comments: reviewed in full Required Braces or Orthoses: Spinal Brace Spinal Brace: Lumbar corset;Applied in supine position;Applied in sitting position Restrictions Weight Bearing Restrictions: No      Mobility Bed Mobility Overal bed mobility: Needs Assistance Bed Mobility: Supine to Sit;Sit to Supine     Supine to sit: Supervision Sit to supine: Supervision   General bed mobility comments: Pt OOB in recliner, reviewed verbally with handout  Transfers Overall transfer level: Needs assistance Equipment used: None Transfers: Sit to/from Stand Sit to Stand: Supervision         General transfer comment: at times impulsive with VC to slow movements for reduced fall risk    Balance Overall balance assessment: Needs  assistance Sitting-balance support: No upper extremity supported;Feet supported Sitting balance-Leahy Scale: Good     Standing balance support: During functional activity;No upper extremity supported Standing balance-Leahy Scale: Good                             ADL either performed or assessed with clinical judgement   ADL Overall ADL's : Modified independent                                       General ADL Comments: Pt educated on compensatory strategies for maintaining BLT precautions. Pt demonstrated ability to don/doff brace LB ADL and toilet transfer. Pt educated on long handle sponge, toilet aide, Hotel manager.      Vision Patient Visual Report: No change from baseline       Perception     Praxis      Pertinent Vitals/Pain Pain Assessment: Faces Faces Pain Scale: Hurts little more Pain Location: incision site with transfers Pain Descriptors / Indicators: Grimacing;Sore Pain Intervention(s): Monitored during session;Repositioned     Hand Dominance Right   Extremity/Trunk Assessment Upper Extremity Assessment Upper Extremity Assessment: Overall WFL for tasks assessed   Lower Extremity Assessment Lower Extremity Assessment: Overall WFL for tasks assessed   Cervical / Trunk Assessment Cervical / Trunk Assessment: Other exceptions Cervical / Trunk Exceptions: s/p sx   Communication Communication Communication: No difficulties   Cognition Arousal/Alertness: Awake/alert Behavior During Therapy: WFL for tasks assessed/performed Overall Cognitive Status: Within Functional Limits for tasks assessed  General Comments  Pt ansking about when he could get back to eliptical machine exercise    Exercises     Shoulder Instructions      Home Living Family/patient expects to be discharged to:: Private residence Living Arrangements: Spouse/significant other Available Help at  Discharge: Family Type of Home: House       Home Layout: One level     Bathroom Shower/Tub: Teacher, early years/pre: Standard     Home Equipment: Environmental consultant - 2 wheels          Prior Functioning/Environment Level of Independence: Independent        Comments: works as a Hydrographic surveyor Problem List:        OT Treatment/Interventions:      OT Goals(Current goals can be found in the care plan section) Acute Rehab OT Goals Patient Stated Goal: return to home and work OT Goal Formulation: With patient Time For Goal Achievement: 04/03/17 Potential to Achieve Goals: Good  OT Frequency:     Barriers to D/C:            Co-evaluation              AM-PAC PT "6 Clicks" Daily Activity     Outcome Measure Help from another person eating meals?: None Help from another person taking care of personal grooming?: None Help from another person toileting, which includes using toliet, bedpan, or urinal?: A Little Help from another person bathing (including washing, rinsing, drying)?: None Help from another person to put on and taking off regular upper body clothing?: None Help from another person to put on and taking off regular lower body clothing?: A Little 6 Click Score: 22   End of Session Equipment Utilized During Treatment: Back brace Nurse Communication: Mobility status  Activity Tolerance: Patient tolerated treatment well Patient left: Other (comment)(with RN to remove drain)                   Time: 3244-0102 OT Time Calculation (min): 18 min Charges:  OT General Charges $OT Visit: 1 Visit OT Evaluation $OT Eval Low Complexity: 1 Low G-Codes:     Hulda Humphrey OTR/L [place] 03/20/2017, 10:42 AM

## 2017-03-20 NOTE — Progress Notes (Signed)
Physical Therapy Evaluation Patient Details Name: Andrew Rojas MRN: [id number] DOB: 1970-04-14 Today's Date: 03/20/2017   History of Present Illness  Andrew Rojas is a pleasant 47 y/o male admitted on 03/19/17 for L3-4 PLIF with L4-5 hardware removal. Patient with a PMH significant for HTN, lumbar stenosis, and sleep apnea.  Clinical Impression  Patient admitted with the above listed diagnosis. PTA, patient was independent with all ADLs and functional mobility without use of AD. 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 Patient performing bed mobility and transfers with [employer] to [employer] for safety. VC needed to ensure maintenance of spinal precautions with good carryover. Gait/stair training with [employer] to [employer] for safety with no LOB noted and no need for AD. Patient with good caregiver support at home to assist intermittently if needed.     Follow Up Recommendations Follow surgeon's recommendation for DC plan and follow-up therapies;Supervision - Intermittent    Equipment Recommendations  None recommended by PT    Recommendations for Other Services OT consult     Precautions / Restrictions Precautions Precautions: Back;Fall Required Braces or Orthoses: Spinal Brace Spinal Brace: Lumbar corset;Applied in supine position;Applied in sitting position Restrictions Weight Bearing Restrictions: No      Mobility  Bed Mobility Overal bed mobility: Needs Assistance Bed Mobility: Supine to Sit;Sit to Supine     Supine to sit: Supervision Sit to supine: Supervision   General bed mobility comments: VC for back precautions with patient able to maintain  Transfers Overall transfer level: Needs assistance Equipment used: None Transfers: Sit to/from Stand Sit to Stand: Min guard;Supervision         General transfer comment: at times impulsive with VC to slow movements for reduced fall risk  Ambulation/Gait Ambulation/Gait assistance: Min  guard;Supervision Ambulation Distance (Feet): 400 Feet Assistive device: None(pushed IV pole) Gait Pattern/deviations: Step-through pattern;Decreased stride length;Drifts right/left        Stairs Stairs: Yes Stairs assistance: Min guard;Supervision Stair Management: One rail Right;Alternating pattern Number of Stairs: 6 General stair comments: Min guard/SUP for safety. VC for hand rail use to maximize safety  Wheelchair Mobility    Modified Rankin (Stroke Patients Only)       Balance Overall balance assessment: Needs assistance Sitting-balance support: No upper extremity supported;Feet supported Sitting balance-Leahy Scale: Good     Standing balance support: Single extremity supported;During functional activity Standing balance-Leahy Scale: Fair                               Pertinent Vitals/Pain Pain Assessment: No/denies pain    Home Living Family/patient expects to be discharged to:: Private residence Living Arrangements: Spouse/significant other Available Help at Discharge: Family Type of Home: House       Home Layout: One level Home Equipment: Environmental consultant - 2 wheels      Prior Function Level of Independence: Independent         Comments: works as a Administrator        Extremity/Trunk Assessment   Upper Extremity Assessment Upper Extremity Assessment: Defer to OT evaluation    Lower Extremity Assessment Lower Extremity Assessment: Overall WFL for tasks assessed       Communication   Communication: No difficulties  Cognition Arousal/Alertness: Awake/alert Behavior During Therapy: WFL for tasks assessed/performed Overall Cognitive Status: Within Functional Limits for tasks assessed  General Comments      Exercises     Assessment/Plan    PT Assessment Patient needs continued PT services  PT Problem List Decreased strength;Decreased activity  tolerance;Decreased balance;Decreased mobility;Decreased safety awareness       PT Treatment Interventions DME instruction;Gait training;Stair training;Functional mobility training;Therapeutic activities;Therapeutic exercise;Balance training;Neuromuscular re-education;Patient/family education;Manual techniques    PT Goals (Current goals can be found in the Care Plan section)  Acute Rehab PT Goals Patient Stated Goal: return to home and wokr PT Goal Formulation: With patient Time For Goal Achievement: 03/27/17 Potential to Achieve Goals: Good    Frequency Min 5X/week   Barriers to discharge        Co-evaluation               AM-PAC PT "6 Clicks" Daily Activity  Outcome Measure Difficulty turning over in bed (including adjusting bedclothes, sheets and blankets)?: A Little Difficulty moving from lying on back to sitting on the side of the bed? : A Lot Difficulty sitting down on and standing up from a chair with arms (e.g., wheelchair, bedside commode, etc,.)?: Unable Help needed moving to and from a bed to chair (including a wheelchair)?: A Little Help needed walking in hospital room?: A Little Help needed climbing 3-5 steps with a railing? : A Little 6 Click Score: 15    End of Session Equipment Utilized During Treatment: Gait belt;Back brace Activity Tolerance: Patient tolerated treatment well Patient left: in bed;with call bell/phone within reach Nurse Communication: Mobility status PT Visit Diagnosis: Other abnormalities of gait and mobility (R26.89);Muscle weakness (generalized) (M62.81)    Time: 4975-3005 PT Time Calculation (min) (ACUTE ONLY): 24 min   Charges:   PT Evaluation $PT Eval Low Complexity: 1 Low PT Treatments $Gait Training: 8-22 mins   PT G Codes:        Lanney Gins, PT, DPT 03/20/17 9:57 AM

## 2017-03-20 NOTE — Progress Notes (Signed)
Neurosurgery Progress Note  No issues overnight. Pain minimal Ambulating without difficulties Voiding normal Would like to go home  EXAM:  BP 134/64 (BP Location: Right Arm)   Pulse 88   Temp 98.3 F (36.8 C) (Oral)   Resp 18   SpO2 99%   Awake, alert, oriented  Speech fluent, appropriate  CN grossly intact  5/5 BUE/BLE  Incision c/d/i  PLAN Doing well this am Stable for d/c

## 2017-03-22 MED FILL — Thrombin For Soln 20000 Unit: CUTANEOUS | Qty: 1 | Status: AC

## 2017-03-22 NOTE — Anesthesia Postprocedure Evaluation (Signed)
Anesthesia Post Note  Patient: Andrew Rojas  Procedure(s) Performed: POSTERIOR LUMBAR INTERBODY FUSION LUMBAR THREE-FOUR (N/A Spine Lumbar)     Patient location during evaluation: PACU Anesthesia Type: General Level of consciousness: awake and alert Pain management: pain level controlled Vital Signs Assessment: post-procedure vital signs reviewed and stable Respiratory status: spontaneous breathing, nonlabored ventilation, respiratory function stable and patient connected to nasal cannula oxygen Cardiovascular status: blood pressure returned to baseline and stable Postop Assessment: no apparent nausea or vomiting Anesthetic complications: no    Last Vitals:  Vitals:   03/20/17 0400 03/20/17 0800  BP: 134/64 125/68  Pulse: 88 87  Resp: 18 18  Temp: 36.8 C 36.9 C  SpO2: 99% 99%    Last Pain:  Vitals:   03/20/17 0910  TempSrc:   PainSc: 2                  Sidonia Nutter S

## 2019-02-10 ENCOUNTER — Other Ambulatory Visit: Payer: Self-pay | Admitting: Unknown Physician Specialty

## 2019-02-10 DIAGNOSIS — H9212 Otorrhea, left ear: Secondary | ICD-10-CM

## 2019-02-21 ENCOUNTER — Ambulatory Visit
Admission: RE | Admit: 2019-02-21 | Discharge: 2019-02-21 | Disposition: A | Discharge status: Home / Self Care | Payer: BC Managed Care – PPO | Source: Ambulatory Visit | Attending: Unknown Physician Specialty | Admitting: Unknown Physician Specialty

## 2019-02-21 ENCOUNTER — Other Ambulatory Visit: Payer: Self-pay

## 2019-02-21 DIAGNOSIS — H9212 Otorrhea, left ear: Secondary | ICD-10-CM | POA: Diagnosis present

## 2019-03-31 DIAGNOSIS — E785 Hyperlipidemia, unspecified: Secondary | ICD-10-CM | POA: Insufficient documentation

## 2019-03-31 DIAGNOSIS — I10 Essential (primary) hypertension: Secondary | ICD-10-CM | POA: Insufficient documentation

## 2019-05-08 IMAGING — RF DG LUMBAR SPINE 2-3V
1 series · 2 of 2 positions shown · non-contrast
Comparison: MRI lumbar spine 01/28/2017

FLUOROSCOPY TIME:  0 minutes 20 seconds

Images obtained: 2

CLINICAL DATA: L3-L4 PLIF

EXAM:
DG C-ARM 61-120 MIN; LUMBAR SPINE - 2-3 VIEW

[Series 1: run · 2 of 2 slices shown]
[im 1/2]
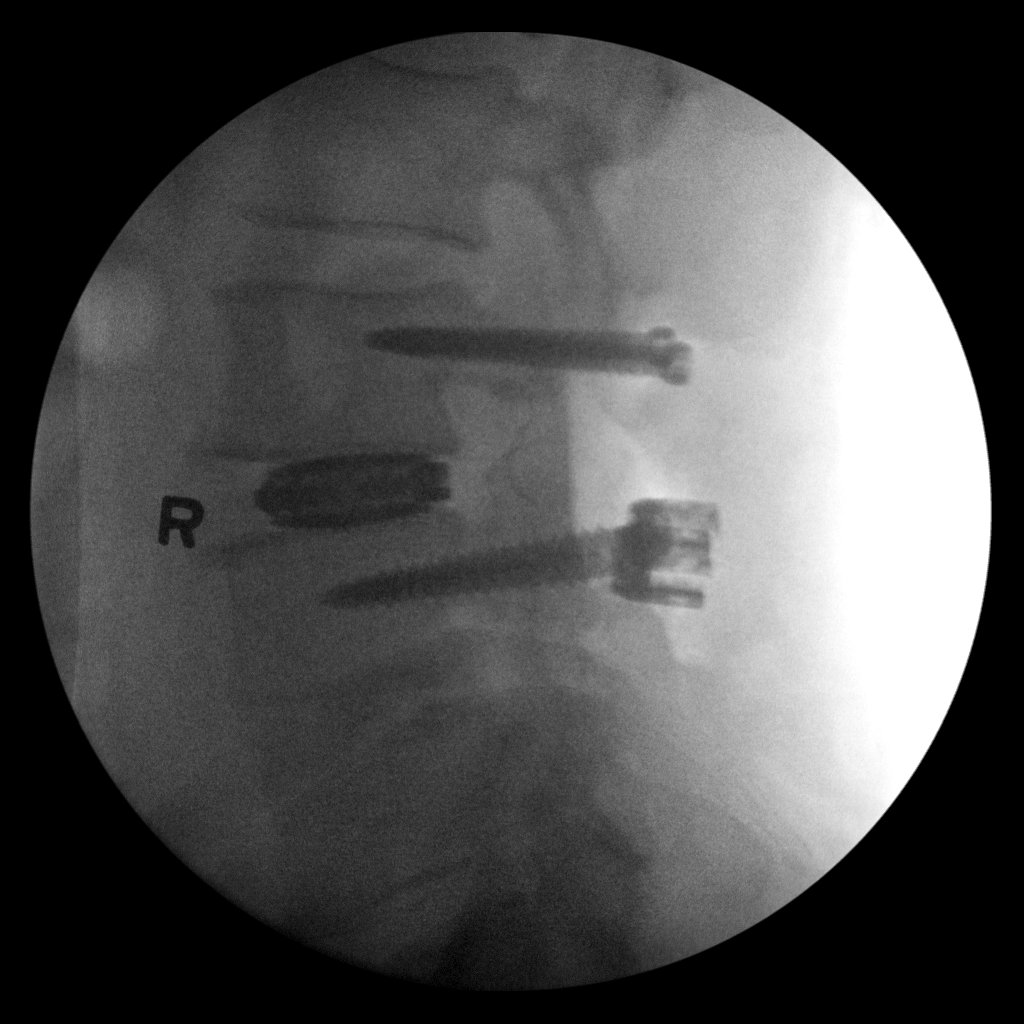
[im 2/2]
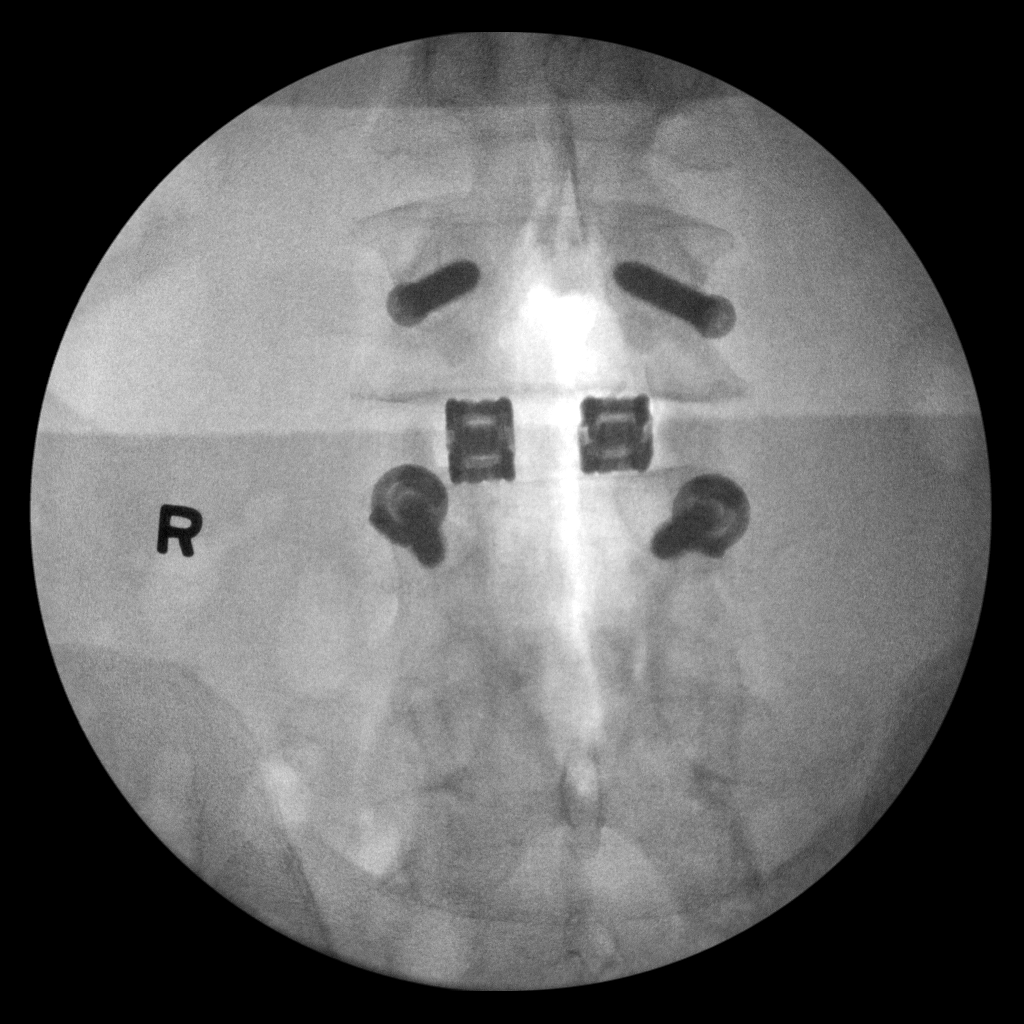

[2 of 2 positions shown; findings below may reference images not displayed]

FINDINGS: Prior MRI labeled with 5 lumbar vertebra.

BILATERAL pedicle screws are present at L3 and L4.

Disc prostheses at L3-L4.

Interval removal of pedicle screws at L5 with noted prior L4-L5
fusion.
IMPRESSION: Intraoperative images during L3-L4 posterior fusion.

## 2019-05-29 ENCOUNTER — Other Ambulatory Visit: Payer: Self-pay

## 2019-05-29 ENCOUNTER — Other Ambulatory Visit
Admission: RE | Admit: 2019-05-29 | Discharge: 2019-05-29 | Disposition: A | Discharge status: Home / Self Care | Payer: BC Managed Care – PPO | Source: Ambulatory Visit | Attending: Internal Medicine | Admitting: Internal Medicine

## 2019-05-29 DIAGNOSIS — Z20822 Contact with and (suspected) exposure to covid-19: Secondary | ICD-10-CM | POA: Diagnosis not present

## 2019-05-29 DIAGNOSIS — Z01812 Encounter for preprocedural laboratory examination: Secondary | ICD-10-CM | POA: Insufficient documentation

## 2019-05-29 LAB — SARS CORONAVIRUS 2 (TAT 6-24 HRS): SARS Coronavirus 2: NEGATIVE

## 2019-05-30 ENCOUNTER — Encounter: Payer: Self-pay | Admitting: Internal Medicine

## 2019-05-31 ENCOUNTER — Ambulatory Visit: Payer: BC Managed Care – PPO | Admitting: Anesthesiology

## 2019-05-31 ENCOUNTER — Encounter: Payer: Self-pay | Admitting: Internal Medicine

## 2019-05-31 ENCOUNTER — Encounter
Admission: RE | Disposition: A | Discharge status: Home / Self Care | Payer: Self-pay | Source: Home / Self Care | Attending: Internal Medicine

## 2019-05-31 ENCOUNTER — Ambulatory Visit
Admission: RE | Admit: 2019-05-31 | Discharge: 2019-05-31 | Disposition: A | Discharge status: Home / Self Care | Payer: BC Managed Care – PPO | Attending: Internal Medicine | Admitting: Internal Medicine

## 2019-05-31 DIAGNOSIS — G4730 Sleep apnea, unspecified: Secondary | ICD-10-CM | POA: Diagnosis not present

## 2019-05-31 DIAGNOSIS — Z8371 Family history of colonic polyps: Secondary | ICD-10-CM | POA: Insufficient documentation

## 2019-05-31 DIAGNOSIS — Z1211 Encounter for screening for malignant neoplasm of colon: Secondary | ICD-10-CM | POA: Diagnosis not present

## 2019-05-31 DIAGNOSIS — D123 Benign neoplasm of transverse colon: Secondary | ICD-10-CM | POA: Diagnosis not present

## 2019-05-31 DIAGNOSIS — I10 Essential (primary) hypertension: Secondary | ICD-10-CM | POA: Diagnosis not present

## 2019-05-31 DIAGNOSIS — M1990 Unspecified osteoarthritis, unspecified site: Secondary | ICD-10-CM | POA: Insufficient documentation

## 2019-05-31 DIAGNOSIS — Z79899 Other long term (current) drug therapy: Secondary | ICD-10-CM | POA: Diagnosis not present

## 2019-05-31 DIAGNOSIS — K640 First degree hemorrhoids: Secondary | ICD-10-CM | POA: Insufficient documentation

## 2019-05-31 HISTORY — DX: Gastro-esophageal reflux disease without esophagitis: K21.9

## 2019-05-31 HISTORY — DX: Hyperlipidemia, unspecified: E78.5

## 2019-05-31 HISTORY — DX: Other chest pain: R07.89

## 2019-05-31 HISTORY — PX: COLONOSCOPY WITH PROPOFOL: SHX5780

## 2019-05-31 HISTORY — DX: Unspecified hemorrhoids: K64.9

## 2019-05-31 HISTORY — DX: Other specified abnormal findings of blood chemistry: R79.89

## 2019-05-31 HISTORY — DX: Cardiac arrhythmia, unspecified: I49.9

## 2019-05-31 SURGERY — COLONOSCOPY WITH PROPOFOL
Anesthesia: General

## 2019-05-31 MED ORDER — MIDAZOLAM HCL 2 MG/2ML IJ SOLN
INTRAMUSCULAR | Status: AC
  Filled 2019-05-31: qty 2

## 2019-05-31 MED ORDER — PROPOFOL 10 MG/ML IV BOLUS
INTRAVENOUS | Status: AC
  Filled 2019-05-31: qty 20

## 2019-05-31 MED ORDER — LIDOCAINE HCL (CARDIAC) PF 100 MG/5ML IV SOSY
PREFILLED_SYRINGE | INTRAVENOUS | Status: DC | PRN
  Administered 2019-05-31: 60 mg via INTRAVENOUS

## 2019-05-31 MED ORDER — PROPOFOL 10 MG/ML IV BOLUS
INTRAVENOUS | Status: DC | PRN
  Administered 2019-05-31: 30 mg via INTRAVENOUS
  Administered 2019-05-31: 100 mg via INTRAVENOUS

## 2019-05-31 MED ORDER — PROPOFOL 500 MG/50ML IV EMUL
INTRAVENOUS | Status: DC | PRN
  Administered 2019-05-31: 150 ug/kg/min via INTRAVENOUS

## 2019-05-31 MED ORDER — SODIUM CHLORIDE 0.9 % IV SOLN: INTRAVENOUS | Status: DC | PRN

## 2019-05-31 MED ORDER — MIDAZOLAM HCL 2 MG/2ML IJ SOLN
INTRAMUSCULAR | Status: DC | PRN
  Administered 2019-05-31: 2 mg via INTRAVENOUS

## 2019-05-31 MED ORDER — PROPOFOL 500 MG/50ML IV EMUL
INTRAVENOUS | Status: AC
  Filled 2019-05-31: qty 50

## 2019-05-31 NOTE — H&P (Signed)
Outpatient short stay form Pre-procedure 05/31/2019 1:50 PM Anasia Agro K. Alice Reichert, M.D.  Primary Physician: Fulton Reek, M.D.  Reason for visit:  Family history of colon polyps - Father.  History of present illness:  49 year old patient presenting for family history of colon polyps in his father. Patient denies any change in bowel habits, rectal bleeding or involuntary weight loss.    No current facility-administered medications for this encounter.  Medications Prior to Admission  Medication Sig Dispense Refill Last Dose  . AMINO ACIDS-PROTEIN HYDROLYS PO Take by mouth.     . CHOLECALCIFEROL PO Take 1,000 Units by mouth daily.     . clomiPHENE (CLOMID) 50 MG tablet Take 50 mg by mouth daily.     . cyclobenzaprine (FLEXERIL) 10 MG tablet Take 1 tablet (10 mg total) by mouth 3 (three) times daily as needed for muscle spasms. 90 tablet 2   . fluticasone (FLONASE) 50 MCG/ACT nasal spray Place 2 sprays into both nostrils daily.  11   . ibuprofen (ADVIL,MOTRIN) 200 MG tablet Take 800 mg by mouth every 6 (six) hours as needed (for pain.).     Marland Kitchen losartan (COZAAR) 100 MG tablet Take 100 mg by mouth daily.  3   . metoprolol succinate (TOPROL-XL) 50 MG 24 hr tablet Take 50 mg by mouth daily.  3   . Multiple Vitamin (MULTIVITAMIN WITH MINERALS) TABS tablet Take 1 tablet by mouth daily. Men's One A Day Multivitamin     . Multiple Vitamins-Minerals (EMERGEN-C IMMUNE) PACK Take 1 packet by mouth daily as needed (for immune health).     . naproxen sodium (ALEVE) 220 MG tablet Take 440 mg by mouth 2 (two) times daily as needed (for back and muscle pain.).     Marland Kitchen Omega-3 Fatty Acids (FISH OIL) 1000 MG CAPS Take 1,000 mg by mouth daily.     Marland Kitchen OVER THE COUNTER MEDICATION Take 1 capsule by mouth daily as needed (prior to workouts.). BCAA (BRANCHED-CHAIN AMINO ACID)     . oxyCODONE-acetaminophen (PERCOCET) 7.5-325 MG tablet Take 1 tablet by mouth every 4 (four) hours as needed for severe pain. 60 tablet 0   .  phenylephrine (NEO-SYNEPHRINE) 0.25 % nasal spray Place 1 spray into both nostrils every 6 (six) hours as needed for congestion.     . pseudoephedrine-guaifenesin (MUCINEX D) 60-600 MG 12 hr tablet Take 1 tablet by mouth daily as needed (sinus issues/congestion.).     Marland Kitchen Soft Lens Products (REWETTING DROPS) SOLN Place 1-2 drops into both eyes daily as needed (for contact lenses.).        No Known Allergies   Past Medical History:  Diagnosis Date  . Arthritis    back  . Dysrhythmia   . GERD (gastroesophageal reflux disease)   . Hemorrhoids   . Hyperlipidemia   . Hypertension   . Low testosterone   . Lumbar stenosis   . Non-cardiac chest pain   . PONV (postoperative nausea and vomiting)   . Sleep apnea    wears CPAP    Review of systems:  Otherwise negative.    Physical Exam  Gen: Alert, oriented. Appears stated age.  HEENT: [place]/AT. PERRLA. Lungs: CTA, no wheezes. CV: RR nl S1, S2. Abd: soft, benign, no masses. BS+ Ext: No edema. Pulses 2+    Planned procedures: Proceed with colonoscopy. The patient understands the nature of the planned procedure, indications, risks, alternatives and potential complications including but not limited to bleeding, infection, perforation, damage to internal organs and  possible oversedation/side effects from anesthesia. The patient agrees and gives consent to proceed.  Please refer to procedure notes for findings, recommendations and patient disposition/instructions.     Braylynn Lewing K. Alice Reichert, M.D. Gastroenterology 05/31/2019  1:50 PM

## 2019-05-31 NOTE — Op Note (Signed)
[facility] Gastroenterology Patient Name: Andrew Rojas Procedure Date: 05/31/2019 3:23 PM MRN: [id number] Account #: [id number] Date of Birth: 1970-01-26 Admit Type: Outpatient Age: 49 Room: [facility] ENDO ROOM 3 Gender: Male Note Status: Finalized Procedure:             Colonoscopy Indications:           Colon cancer screening in patient at increased risk:                         Family history of 1st-degree relative with colon polyps Providers:             Benay Pike. Brandell Maready MD, MD Medicines:             Propofol per Anesthesia Complications:         No immediate complications. Procedure:             Pre-Anesthesia Assessment:                        - The risks and benefits of the procedure and the                         sedation options and risks were discussed with the                         patient. All questions were answered and informed                         consent was obtained.                        - Patient identification and proposed procedure were                         verified prior to the procedure by the nurse. The                         procedure was verified in the procedure room.                        - ASA Grade Assessment: III - A patient with severe                         systemic disease.                        - After reviewing the risks and benefits, the patient                         was deemed in satisfactory condition to undergo the                         procedure.                        After obtaining informed consent, the colonoscope was                         passed under direct vision. Throughout the procedure,  the patient's blood pressure, pulse, and oxygen                         saturations were monitored continuously. The                         Colonoscope was introduced through the anus and                         advanced to the the cecum, identified by appendiceal   orifice and ileocecal valve. The colonoscopy was                         performed without difficulty. The patient tolerated                         the procedure well. The quality of the bowel                         preparation was adequate. The ileocecal valve,                         appendiceal orifice, and rectum were photographed. Findings:      The perianal and digital rectal examinations were normal. Pertinent       negatives include normal sphincter tone and no palpable rectal lesions.      Non-bleeding internal hemorrhoids were found during retroflexion. The       hemorrhoids were Grade I (internal hemorrhoids that do not prolapse).      A 4 mm polyp was found in the transverse colon. The polyp was sessile.       The polyp was removed with a jumbo cold forceps. Resection and retrieval       were complete.      A 5 mm polyp was found in the mid transverse colon. The polyp was       sessile. The polyp was removed with a jumbo cold forceps. Resection and       retrieval were complete.      A few medium-mouthed diverticula were found in the sigmoid colon.      The exam was otherwise without abnormality. Impression:            - Non-bleeding internal hemorrhoids.                        - One 4 mm polyp in the transverse colon, removed with                         a jumbo cold forceps. Resected and retrieved.                        - One 5 mm polyp in the mid transverse colon, removed                         with a jumbo cold forceps. Resected and retrieved.                        - The examination was otherwise normal. Recommendation:        - Patient has a contact number available for  emergencies. The signs and symptoms of potential                         delayed complications were discussed with the patient.                         Return to normal activities tomorrow. Written                         discharge instructions were provided to the patient.                         - Resume previous diet.                        - Continue present medications.                        - Repeat colonoscopy is recommended for surveillance.                         The colonoscopy date will be determined after                         pathology results from today's exam become available                         for review.                        - Return to GI office PRN.                        - The findings and recommendations were discussed with                         the patient. Procedure Code(s):     --- Professional ---                        [phone], Colonoscopy, flexible; with biopsy, single or                         multiple Diagnosis Code(s):     --- Professional ---                        K64.0, First degree hemorrhoids                        K63.5, Polyp of colon                        Z83.71, Family history of colonic polyps CPT copyright 2019 American Medical Association. All rights reserved. The codes documented in this report are preliminary and upon coder review may  be revised to meet current compliance requirements. Efrain Sella MD, MD 05/31/2019 3:56:08 PM This report has been signed electronically. Number of Addenda: 0 Note Initiated On: 05/31/2019 3:23 PM Scope Withdrawal Time: 0 hours 12 minutes 11 seconds  Total Procedure Duration: 0 hours 16 minutes 15 seconds  Estimated Blood Loss:  Estimated blood loss: none. Estimated blood loss: none.  [facility]

## 2019-05-31 NOTE — Interval H&P Note (Signed)
History and Physical Interval Note:  05/31/2019 1:52 PM  Andrew Rojas  has presented today for surgery, with the diagnosis of SCREENING FAM HX COLON POLYPS.  The various methods of treatment have been discussed with the patient and family. After consideration of risks, benefits and other options for treatment, the patient has consented to  Procedure(s): COLONOSCOPY WITH PROPOFOL (N/A) as a surgical intervention.  The patient's history has been reviewed, patient examined, no change in status, stable for surgery.  I have reviewed the patient's chart and labs.  Questions were answered to the patient's satisfaction.     [place], [place]

## 2019-05-31 NOTE — Anesthesia Preprocedure Evaluation (Addendum)
Anesthesia Evaluation  Patient identified by MRN, date of birth, ID band Patient awake    Reviewed: Allergy & Precautions, H&P , NPO status , Patient's Chart, lab work & pertinent test results  History of Anesthesia Complications (+) PONV and history of anesthetic complications  Airway Mallampati: I  TM Distance: >3 FB Neck ROM: full    Dental  (+) Teeth Intact   Pulmonary neg shortness of breath, sleep apnea and Continuous Positive Airway Pressure Ventilation , neg COPD, neg recent URI,    breath sounds clear to auscultation       Cardiovascular hypertension, (-) angina(-) Past MI (-) dysrhythmias  Rhythm:regular Rate:Normal     Neuro/Psych negative neurological ROS  negative psych ROS   GI/Hepatic Neg liver ROS, GERD  ,  Endo/Other  negative endocrine ROS  Renal/GU negative Renal ROS     Musculoskeletal   Abdominal   Peds  Hematology negative hematology ROS (+)   Anesthesia Other Findings   Reproductive/Obstetrics negative OB ROS                            Anesthesia Physical Anesthesia Plan  ASA: III  Anesthesia Plan: General   Post-op Pain Management:    Induction:   PONV Risk Score and Plan: Propofol infusion and TIVA  Airway Management Planned: Natural Airway and Nasal Cannula  Additional Equipment:   Intra-op Plan:   Post-operative Plan:   Informed Consent:   Plan Discussed with: Anesthesiologist  Anesthesia Plan Comments:         Anesthesia Quick Evaluation

## 2019-05-31 NOTE — Anesthesia Procedure Notes (Signed)
Date/Time: 05/31/2019 3:30 PM Performed by: Doreen Salvage, CRNA Pre-anesthesia Checklist: Patient identified, Emergency Drugs available, Suction available and Patient being monitored Patient Re-evaluated:Patient Re-evaluated prior to induction Oxygen Delivery Method: Supernova nasal CPAP Induction Type: IV induction Dental Injury: Teeth and Oropharynx as per pre-operative assessment  Comments: Nasal cannula with etCO2 monitoring

## 2019-05-31 NOTE — Transfer of Care (Signed)
Immediate Anesthesia Transfer of Care Note  Patient: Andrew Rojas  Procedure(s) Performed: Procedure(s): COLONOSCOPY WITH PROPOFOL (N/A)  Patient Location: PACU and Endoscopy Unit  Anesthesia Type:General  Level of Consciousness: sedated  Airway & Oxygen Therapy: Patient Spontanous Breathing and Patient connected to nasal cannula oxygen  Post-op Assessment: Report given to RN and Post -op Vital signs reviewed and stable  Post vital signs: Reviewed and stable  Last Vitals:  Vitals:   05/31/19 1408 05/31/19 1553  BP: (!) 125/99 116/68  Pulse: 94 96  Resp: 20 20  Temp: (!) 36.2 C 36.8 C  SpO2: [id number]    Complications: No apparent anesthesia complications

## 2019-06-01 ENCOUNTER — Encounter: Payer: Self-pay | Admitting: *Deleted

## 2019-06-01 NOTE — Anesthesia Postprocedure Evaluation (Signed)
Anesthesia Post Note  Patient: JANTE VALBUENA II  Procedure(s) Performed: COLONOSCOPY WITH PROPOFOL (N/A )  Patient location during evaluation: PACU Anesthesia Type: General Level of consciousness: awake and alert Pain management: pain level controlled Vital Signs Assessment: post-procedure vital signs reviewed and stable Respiratory status: spontaneous breathing, nonlabored ventilation, respiratory function stable and patient connected to nasal cannula oxygen Cardiovascular status: blood pressure returned to baseline and stable Postop Assessment: no apparent nausea or vomiting Anesthetic complications: no     Last Vitals:  Vitals:   05/31/19 1603 05/31/19 1613  BP: 133/75 127/79  Pulse: 83 75  Resp: 18 20  Temp:    SpO2: 97% 99%    Last Pain:  Vitals:   05/31/19 1613  TempSrc:   PainSc: 0-No pain                 Molli Barrows

## 2019-06-02 LAB — SURGICAL PATHOLOGY

## 2020-01-27 ENCOUNTER — Other Ambulatory Visit: Payer: Self-pay

## 2020-01-27 DIAGNOSIS — Z20822 Contact with and (suspected) exposure to covid-19: Secondary | ICD-10-CM

## 2020-01-30 LAB — NOVEL CORONAVIRUS, NAA: SARS-CoV-2, NAA: NOT DETECTED

## 2021-04-11 IMAGING — CT CT TEMPORAL BONES W/O CM
3 of 6 series · 15 of 40 positions shown, 18 images · non-contrast
Comparison: None.

CLINICAL DATA: Left otorrhea

EXAM:
CT TEMPORAL BONES WITHOUT CONTRAST
TECHNIQUE: Axial and coronal plane CT imaging of the petrous temporal bones was
performed with thin-collimation image reconstruction. No intravenous
contrast was administered. Multiplanar CT image reconstructions were
also generated.

[Series 2: ax soft temperal bones 2.00 ax · axial · 0.34mm/px · z∈[-531,-507]mm · 2 of 38 slices shown]
[im 13/38  brain]
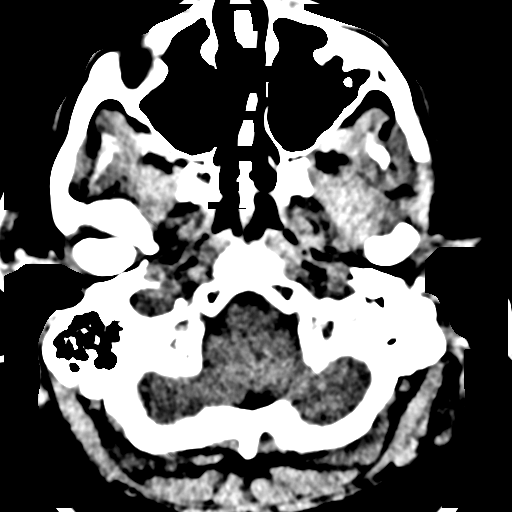
[im 25/38  brain]
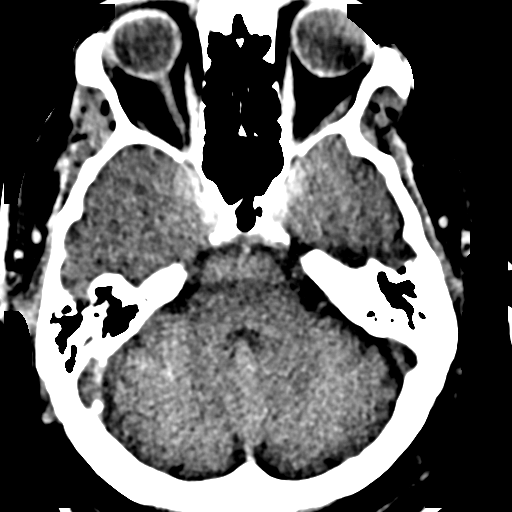

[Series 8: ax mag right temperal bones 0.60 · axial · 0.20mm/px · z∈[-550,-488]mm · 11 of 126 slices shown, 14 images]
[im 11/126  brain]
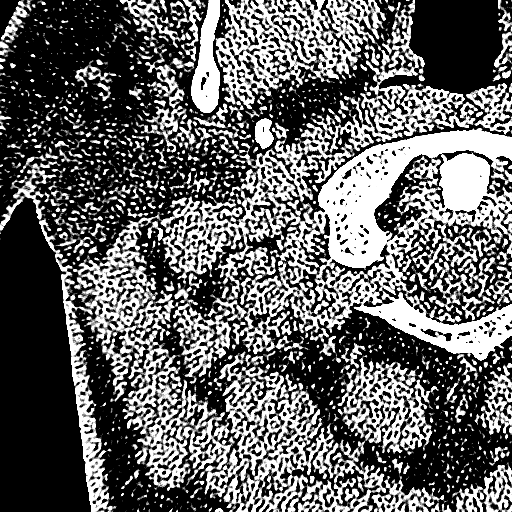
[im 11/126  bone]
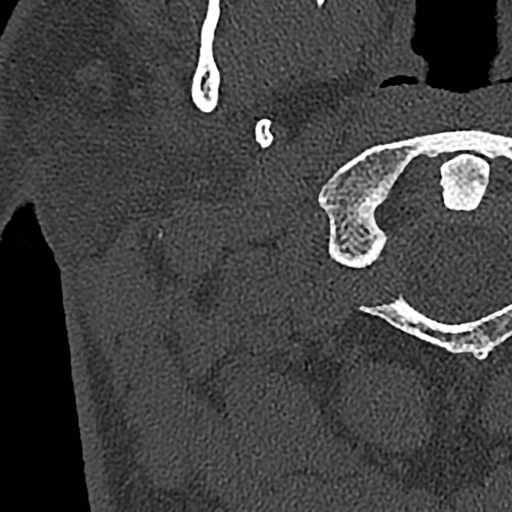
[im 21/126  bone]
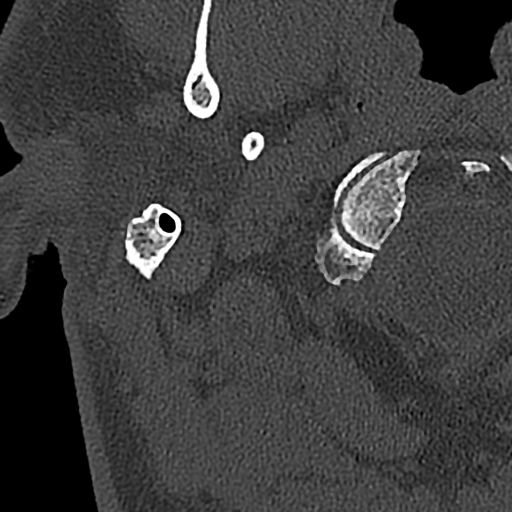
[im 32/126  bone]
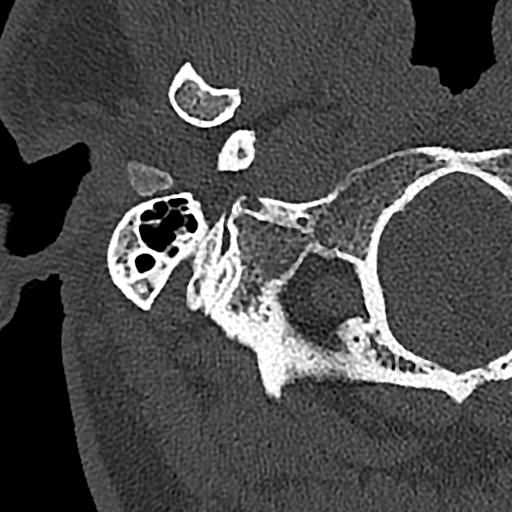
[im 42/126  bone]
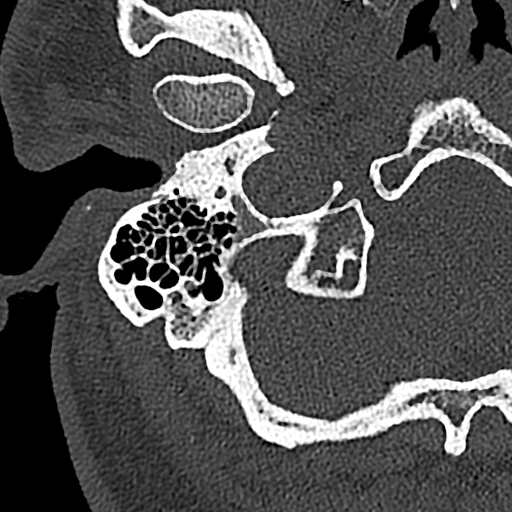
[im 53/126  brain]
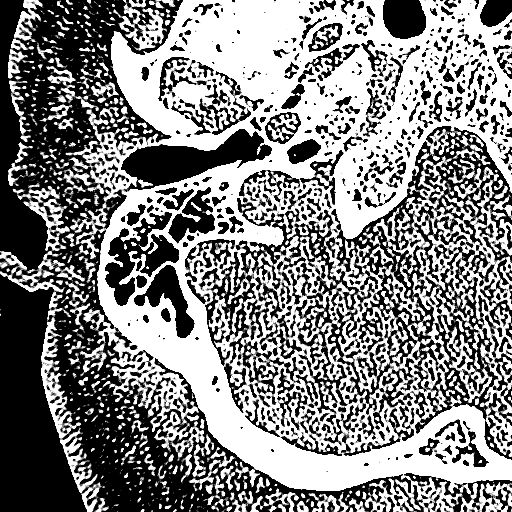
[im 53/126  bone]
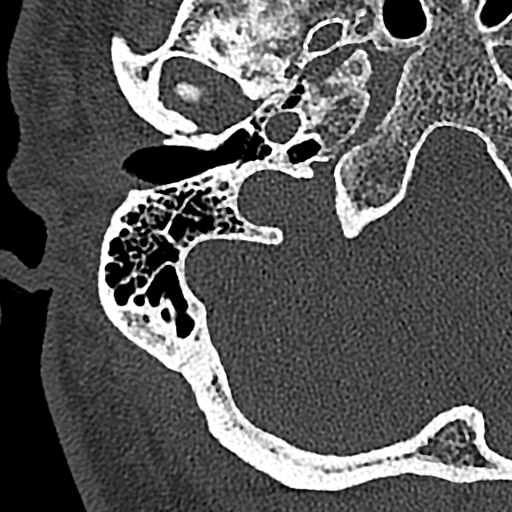
[im 63/126  bone]
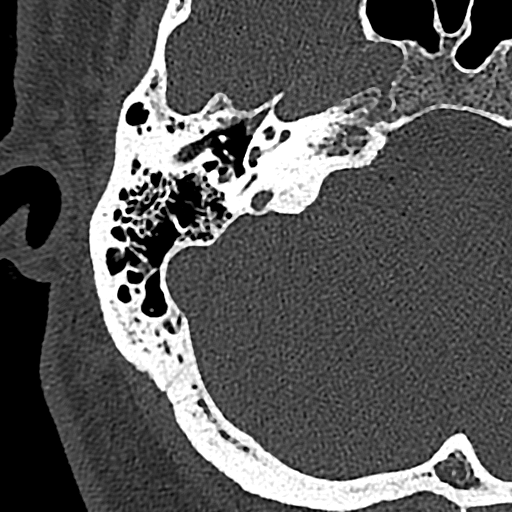
[im 73/126  bone]
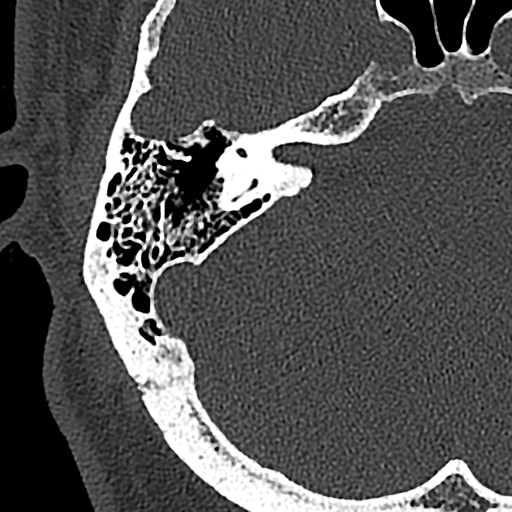
[im 84/126  bone]
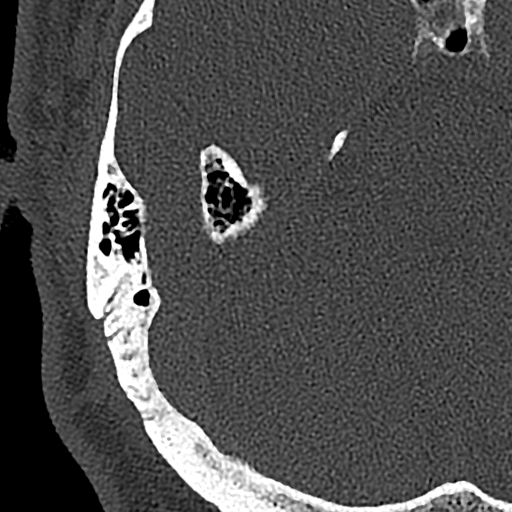
[im 94/126  brain]
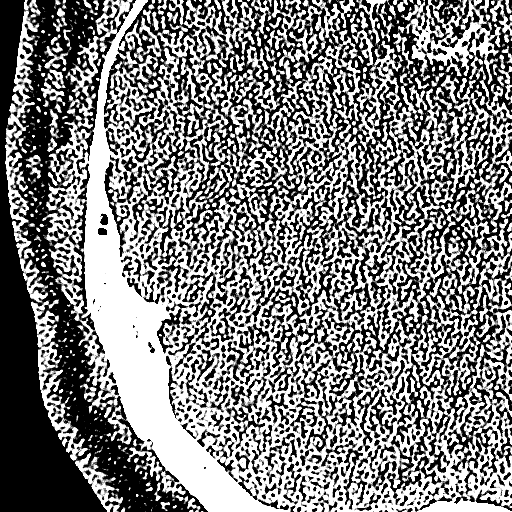
[im 94/126  bone]
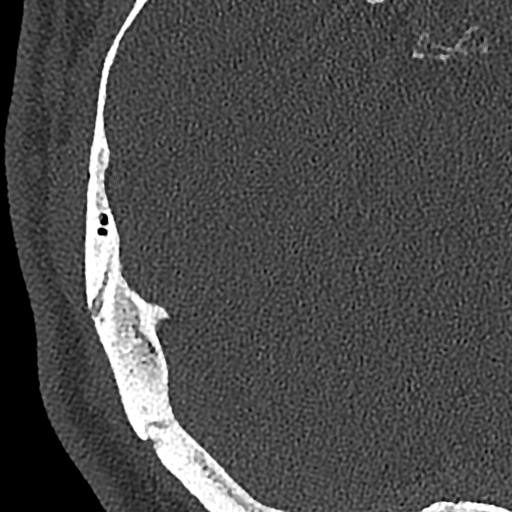
[im 105/126  bone]
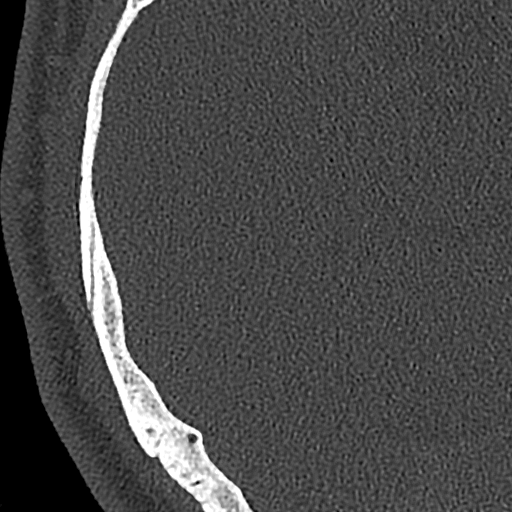
[im 115/126  bone]
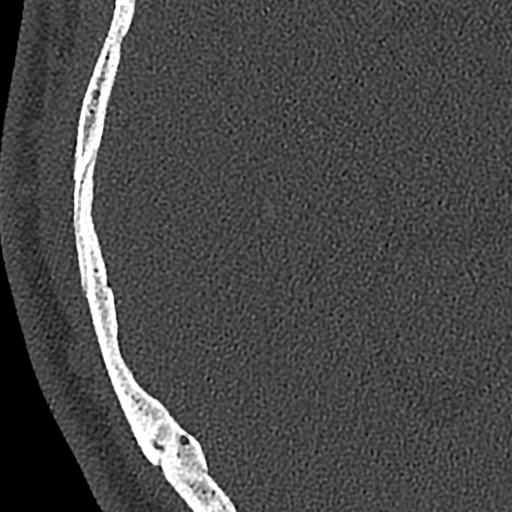

[Series 10: cor mag right temperal bones 0.80 cor · coronal · 0.15mm/px · 2 of 125 slices shown]
[im 42/125  bone]
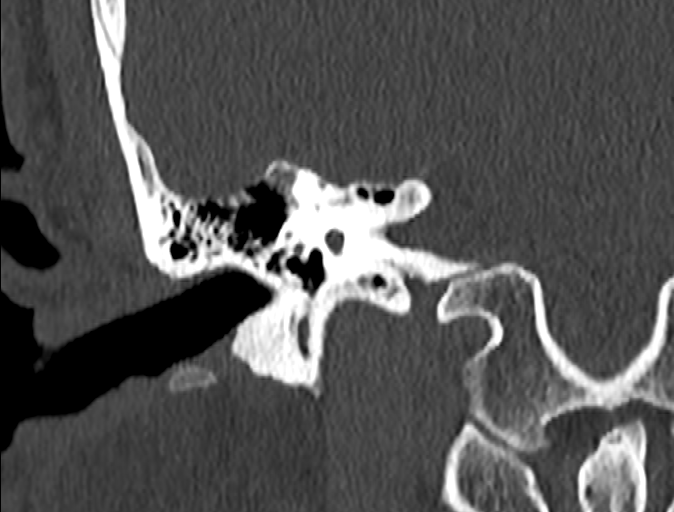
[im 83/125  bone]
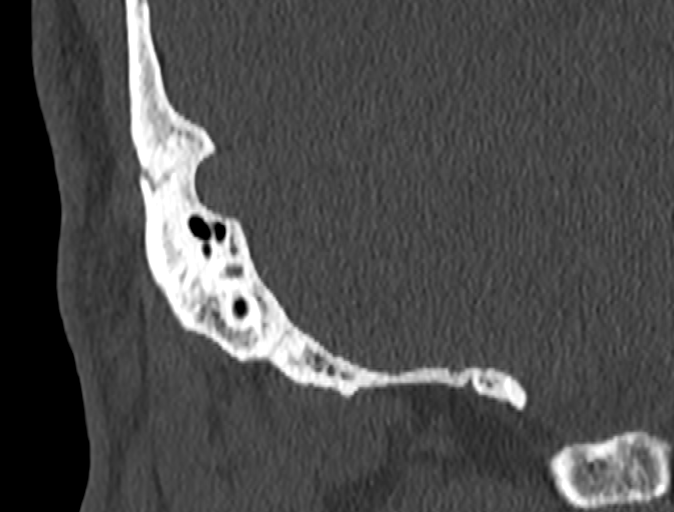

[15 of 40 positions shown; findings below may reference images not displayed]

FINDINGS: RIGHT:

--Pinna and external auditory canal: Normal.

--Ossicular chain: Normal. No erosion or dislocation.

--Tympanic membrane: Normal.

--Middle ear: Normal.

--Epitympanum: The Prussak space is clear. The scutum is sharp.
Tegmen tympani is intact.

--Cochlea, vestibule, vestibular aqueduct and semicircular canals:
Normal. No evidence of canal dehiscence or otospongiosis.

--Internal auditory canal: Normal. No widening of the porus
acusticus.

--Facial nerve: No focal abnormality along the course of the facial
nerve.

--Cerebellopontine angle: Normal.

--Petrous Apex: Normal.

--Mastoids: Normal.

--Carotid canal: Normal position.

LEFT:

--Pinna and external auditory canal: Normal.

--Ossicular chain: Normal. No erosion or dislocation.

--Tympanic membrane: Normal.

--Middle ear: Normal.

--Epitympanum: The Prussak space is clear. The scutum is sharp.
Tegmen tympani is intact.

--Cochlea, vestibule, vestibular aqueduct and semicircular canals:
Normal. No evidence of canal dehiscence or otospongiosis.

--Internal auditory canal: Normal. No widening of the porus
acusticus.

--Facial nerve: No focal abnormality along the course of the facial
nerve.

--Cerebellopontine angle: Normal.

--Petrous Apex: Normal.

--Mastoids: There are postsurgical changes of the left mastoid with
a surgical defect through the lateral mastoid wall. There is
opacification of the inferior mastoid air cells with fluid in the
mastoidectomy cavity. Mastoid roof is intact.

--Carotid canal: Normal position.

OTHER:

--Visualized intracranial: Normal.

--Visualized paranasal sinuses: Normal.

--Nasopharynx: Clear.

--Temporomandibular joints: Normal.

--Visualized extracranial soft tissues: Normal.
IMPRESSION: 1. Postsurgical changes of the left mastoid with opacification of
the inferior mastoid air cells and fluid in the mastoidectomy
cavity. The mastoid roof is intact.
2. Normal right temporal bone.

## 2022-02-24 ENCOUNTER — Other Ambulatory Visit: Payer: Self-pay | Admitting: Unknown Physician Specialty

## 2022-02-24 DIAGNOSIS — H9212 Otorrhea, left ear: Secondary | ICD-10-CM

## 2022-03-02 ENCOUNTER — Ambulatory Visit
Admission: RE | Admit: 2022-03-02 | Discharge: 2022-03-02 | Disposition: A | Discharge status: Home / Self Care | Payer: BC Managed Care – PPO | Source: Ambulatory Visit | Attending: Unknown Physician Specialty | Admitting: Unknown Physician Specialty

## 2022-03-02 DIAGNOSIS — H9212 Otorrhea, left ear: Secondary | ICD-10-CM

## 2022-10-26 ENCOUNTER — Other Ambulatory Visit (INDEPENDENT_AMBULATORY_CARE_PROVIDER_SITE_OTHER): Payer: Self-pay | Admitting: Nurse Practitioner

## 2022-10-26 DIAGNOSIS — M79604 Pain in right leg: Secondary | ICD-10-CM

## 2022-10-29 ENCOUNTER — Ambulatory Visit (INDEPENDENT_AMBULATORY_CARE_PROVIDER_SITE_OTHER): Payer: BC Managed Care – PPO

## 2022-10-29 ENCOUNTER — Encounter (INDEPENDENT_AMBULATORY_CARE_PROVIDER_SITE_OTHER): Payer: Self-pay | Admitting: Nurse Practitioner

## 2022-10-29 ENCOUNTER — Ambulatory Visit (INDEPENDENT_AMBULATORY_CARE_PROVIDER_SITE_OTHER): Payer: BC Managed Care – PPO | Admitting: Nurse Practitioner

## 2022-10-29 VITALS — BP 143/91 | HR 76 | Resp 18 | Ht 71.0 in | Wt 299.8 lb

## 2022-10-29 DIAGNOSIS — R252 Cramp and spasm: Secondary | ICD-10-CM | POA: Diagnosis not present

## 2022-10-29 DIAGNOSIS — I10 Essential (primary) hypertension: Secondary | ICD-10-CM | POA: Diagnosis not present

## 2022-10-29 DIAGNOSIS — L819 Disorder of pigmentation, unspecified: Secondary | ICD-10-CM | POA: Diagnosis not present

## 2022-10-29 DIAGNOSIS — M79605 Pain in left leg: Secondary | ICD-10-CM

## 2022-10-29 DIAGNOSIS — M79604 Pain in right leg: Secondary | ICD-10-CM | POA: Diagnosis not present

## 2022-11-08 NOTE — Progress Notes (Signed)
Subjective:    Patient ID: Andrew Rojas, male    DOB: September 07, 1970, 52 y.o.   MRN: [id number] Chief Complaint  Patient presents with   New Patient (Initial Visit)    NP. abi/consult. BLE leg pain. sparks    Andrew Rojas is a 52 year old male who presents today as a referral from Dr. Judithann Sheen in regards to having cramping in his thighs and calves.  These are somewhat severe and they occur randomly in the middle of the night.  He notes that the right leg is worse than the left.  He has had a history of 2 back surgeries.  He also endorses having some discoloration of his feet in the wintertime with him returning purple.  He notes that he has been told at times he is also have neuropathy.  Leg cramping is especially concerning for him given his family history.  His mother had lymphoma and one of her symptoms was walking as well.  Today patient has ABI of 1.10 on the right and 1.16 on the left.  He has triphasic tibial artery waveforms normal toe waveforms bilaterally.    Review of Systems  Cardiovascular:        Leg cramps  All other systems reviewed and are negative.      Objective:   Physical Exam Vitals reviewed.  HENT:     Head: Normocephalic.  Cardiovascular:     Rate and Rhythm: Normal rate.     Pulses:          Dorsalis pedis pulses are 2+ on the right side and 2+ on the left side.       Posterior tibial pulses are 2+ on the right side and 2+ on the left side.  Pulmonary:     Effort: Pulmonary effort is normal.  Skin:    General: Skin is warm and dry.  Neurological:     Mental Status: He is alert and oriented to person, place, and time.  Psychiatric:        Mood and Affect: Mood normal.        Behavior: Behavior normal.        Thought Content: Thought content normal.        Judgment: Judgment normal.     BP (!) 143/91 (BP Location: Right Arm)   Pulse 76   Resp 18   Ht 5\' 11"  (1.803 m)   Wt 299 lb 12.8 oz (136 kg)   BMI 41.81 kg/m   Past Medical History:   Diagnosis Date   Arthritis    back   Dysrhythmia    GERD (gastroesophageal reflux disease)    Hemorrhoids    Hyperlipidemia    Hypertension    Low testosterone    Lumbar stenosis    Non-cardiac chest pain    PONV (postoperative nausea and vomiting)    Sleep apnea    wears CPAP    Social History   Socioeconomic History   Marital status: Married    Spouse name: Not on file   Number of children: Not on file   Years of education: Not on file   Highest education level: Not on file  Occupational History   Not on file  Tobacco Use   Smoking status: Never   Smokeless tobacco: Former  Advertising account planner   Vaping status: Never Used  Substance and Sexual Activity   Alcohol use: Yes    Comment: rarely   Drug use: No   Sexual activity: Not on file  Other Topics Concern   Not on file  Social History Narrative   Not on file   Social Determinants of Health   Financial Resource Strain: Low Risk  (09/18/2022)   Received from [facility]   Overall Financial Resource Strain (CARDIA)    Difficulty of Paying Living Expenses: Not hard at all  Food Insecurity: No Food Insecurity (09/18/2022)   Received from [facility]   Hunger Vital Sign    Worried About Running Out of Food in the Last Year: Never true    Ran Out of Food in the Last Year: Never true  Transportation Needs: No Transportation Needs (09/18/2022)   Received from [facility] - Transportation    In the past 12 months, has lack of transportation kept you from medical appointments or from getting medications?: No    Lack of Transportation (Non-Medical): No  Physical Activity: Not on file  Stress: Not on file  Social Connections: Not on file  Intimate Partner Violence: Not on file    Past Surgical History:  Procedure Laterality Date   BACK SURGERY     PLIF   BICEPS TENDON REPAIR Right    COLONOSCOPY WITH PROPOFOL N/A 05/31/2019   Procedure: COLONOSCOPY WITH  PROPOFOL;  Surgeon: Toledo, Boykin Nearing, MD;  Location: [facility] ENDOSCOPY;  Service: Gastroenterology;  Laterality: N/A;   HERNIA REPAIR     MASTOIDECTOMY     titanium rods in back     WISDOM TOOTH EXTRACTION      History reviewed. No pertinent family history.  Allergies  Allergen Reactions   Morphine Hypertension       Latest Ref Rng & Units 03/19/2017    3:58 PM 03/17/2017    3:40 PM  CBC  WBC 4.0 - 10.5 K/uL  11.6   Hemoglobin 13.0 - 17.0 g/dL 16.1  09.6   Hematocrit 39.0 - 52.0 % 45.0  51.9   Platelets 150 - 400 K/uL  204       CMP     Component Value Date/Time   NA 141 03/19/2017 1558   K 4.2 03/19/2017 1558   CL 106 03/17/2017 1540   CO2 22 03/17/2017 1540   GLUCOSE 121 (H) 03/19/2017 1558   BUN 9 03/17/2017 1540   CREATININE 1.08 03/17/2017 1540   CALCIUM 9.3 03/17/2017 1540   GFRNONAA >60 03/17/2017 1540     VAS [place] ABI WITH/WO TBI  Result Date: 10/29/2022  LOWER EXTREMITY DOPPLER STUDY Patient Name:  Andrew Rojas  Date of Exam:   10/29/2022 Medical Rec #: [id number]      Accession #:    [id number] Date of Birth: [birth year]       Patient Gender: M Patient Age:   2 years Exam Location:  [facility] Procedure:      VAS [place] ABI WITH/WO TBI Referring Phys: --------------------------------------------------------------------------------  Indications: Rest pain.  Performing Technologist: Salvadore Farber RVT  Examination Guidelines: A complete evaluation includes at minimum, Doppler waveform signals and systolic blood pressure reading at the level of bilateral brachial, anterior tibial, and posterior tibial arteries, when vessel segments are accessible. Bilateral testing is considered an integral part of a complete examination. Photoelectric Plethysmograph (PPG) waveforms and toe systolic pressure readings are included as required and additional duplex testing as needed. Limited examinations for reoccurring indications may be performed as noted.  ABI Findings:  +---------+------------------+-----+---------+--------+ Right    Rt Pressure (mmHg)IndexWaveform Comment  +---------+------------------+-----+---------+--------+ Brachial 140                                      +---------+------------------+-----+---------+--------+  ATA      153               1.06 triphasic         +---------+------------------+-----+---------+--------+ PTA      160               1.10 triphasic         +---------+------------------+-----+---------+--------+ Great Toe182               1.26 Normal            +---------+------------------+-----+---------+--------+ +---------+------------------+-----+---------+-------+ Left     Lt Pressure (mmHg)IndexWaveform Comment +---------+------------------+-----+---------+-------+ Brachial 145                                     +---------+------------------+-----+---------+-------+ ATA      168               1.16 triphasic        +---------+------------------+-----+---------+-------+ PTA      163               1.12 triphasic        +---------+------------------+-----+---------+-------+ Great Toe159               1.10 Normal           +---------+------------------+-----+---------+-------+  Summary: Right: Resting right ankle-brachial index is within normal range. The right toe-brachial index is normal. Left: Resting left ankle-brachial index is within normal range. The left toe-brachial index is normal. *See table(s) above for measurements and observations.  Electronically signed by Levora Dredge MD on 10/29/2022 at 6:00:51 PM.    Final        Assessment & Plan:   1. Leg cramps Today noninvasive studies do not account for the patient's lower extremity leg cramps.  He has normal ABIs bilaterally with strong lower extremity perfusion.  We had a discussion regarding several possible causes including dehydration, venous issues or musculoskeletal issues.  Will have the patient return for bilateral  venous reflux in order to evaluate if there may be any possibility of venous insufficiency that may be contributing to his cramping.  Alternatively I suggested to him that this may be related to his lower back.  2. Primary hypertension Continue antihypertensive medications as already ordered, these medications have been reviewed and there are no changes at this time.   3. Discoloration of skin of foot I suspect that the discoloration may be related to some venous insufficiency or possibly Raynaud's disease.  We will evaluate when he returns for his noninvasive studies noted above.    Current Outpatient Medications on File Prior to Visit  Medication Sig Dispense Refill   fexofenadine-pseudoephedrine (ALLEGRA-D) 60-120 MG 12 hr tablet Take 1 tablet by mouth 2 (two) times daily.     fluticasone (FLONASE) 50 MCG/ACT nasal spray Place 2 sprays into both nostrils daily.  11   ibuprofen (ADVIL,MOTRIN) 200 MG tablet Take 800 mg by mouth every 6 (six) hours as needed (for pain.).     losartan (COZAAR) 100 MG tablet Take 100 mg by mouth daily.  3   magnesium oxide (MAG-OX) 400 MG tablet Take 400 mg by mouth daily.     metoprolol succinate (TOPROL-XL) 50 MG 24 hr tablet Take 50 mg by mouth daily. 3 times daily  3   Multiple Vitamin (MULTIVITAMIN WITH MINERALS) TABS tablet Take 1 tablet by mouth daily. Men's One A Day Multivitamin  NEOMYCIN-POLYMYXIN-HYDROCORTISONE (CORTISPORIN) 1 % SOLN OTIC solution 3 drops.     tamsulosin (FLOMAX) 0.4 MG CAPS capsule Take 0.4 mg by mouth daily.     No current facility-administered medications on file prior to visit.    There are no Patient Instructions on file for this visit. No follow-ups on file.   Georgiana Spinner, NP

## 2022-11-26 ENCOUNTER — Other Ambulatory Visit (INDEPENDENT_AMBULATORY_CARE_PROVIDER_SITE_OTHER): Payer: Self-pay | Admitting: Nurse Practitioner

## 2022-11-26 DIAGNOSIS — R252 Cramp and spasm: Secondary | ICD-10-CM

## 2022-12-02 ENCOUNTER — Ambulatory Visit (INDEPENDENT_AMBULATORY_CARE_PROVIDER_SITE_OTHER): Payer: BC Managed Care – PPO | Admitting: Nurse Practitioner

## 2022-12-02 ENCOUNTER — Ambulatory Visit (INDEPENDENT_AMBULATORY_CARE_PROVIDER_SITE_OTHER): Payer: BC Managed Care – PPO

## 2022-12-02 ENCOUNTER — Encounter (INDEPENDENT_AMBULATORY_CARE_PROVIDER_SITE_OTHER): Payer: Self-pay | Admitting: Nurse Practitioner

## 2022-12-02 VITALS — BP 127/82 | HR 92 | Resp 18 | Ht 71.0 in | Wt 300.4 lb

## 2022-12-02 DIAGNOSIS — R252 Cramp and spasm: Secondary | ICD-10-CM | POA: Diagnosis not present

## 2022-12-02 DIAGNOSIS — I83813 Varicose veins of bilateral lower extremities with pain: Secondary | ICD-10-CM | POA: Diagnosis not present

## 2022-12-02 DIAGNOSIS — I10 Essential (primary) hypertension: Secondary | ICD-10-CM

## 2022-12-06 NOTE — Progress Notes (Incomplete)
Subjective:    Patient ID: Andrew Rojas, male    DOB: 03/19/1970, 52 y.o.   MRN: [id number] Chief Complaint  Patient presents with  . Follow-up    pt conv BIL reflux    HPI  Review of Systems     Objective:   Physical Exam  BP 127/82 (BP Location: Right Arm)   Pulse 92   Resp 18   Ht 5\' 11"  (1.803 m)   Wt (!) 300 lb 6.4 oz (136.3 kg)   BMI 41.90 kg/m   Past Medical History:  Diagnosis Date  . Arthritis    back  . Dysrhythmia   . GERD (gastroesophageal reflux disease)   . Hemorrhoids   . Hyperlipidemia   . Hypertension   . Low testosterone   . Lumbar stenosis   . Non-cardiac chest pain   . PONV (postoperative nausea and vomiting)   . Sleep apnea    wears CPAP    Social History   Socioeconomic History  . Marital status: Married    Spouse name: Not on file  . Number of children: Not on file  . Years of education: Not on file  . Highest education level: Not on file  Occupational History  . Not on file  Tobacco Use  . Smoking status: Never  . Smokeless tobacco: Former  Advertising account planner  . Vaping status: Never Used  Substance and Sexual Activity  . Alcohol use: Yes    Comment: rarely  . Drug use: No  . Sexual activity: Not on file  Other Topics Concern  . Not on file  Social History Narrative  . Not on file   Social Determinants of Health   Financial Resource Strain: Low Risk  (09/18/2022)   Received from [facility]   Overall Financial Resource Strain (CARDIA)   . Difficulty of Paying Living Expenses: Not hard at all  Food Insecurity: No Food Insecurity (09/18/2022)   Received from [facility]   Hunger Vital Sign   . Worried About Programme researcher, broadcasting/film/video in the Last Year: Never true   . Ran Out of Food in the Last Year: Never true  Transportation Needs: No Transportation Needs (09/18/2022)   Received from [facility]   [facility] - Transportation   . In the past 12 months, has lack of transportation  kept you from medical appointments or from getting medications?: No   . Lack of Transportation (Non-Medical): No  Physical Activity: Not on file  Stress: Not on file  Social Connections: Not on file  Intimate Partner Violence: Not on file    Past Surgical History:  Procedure Laterality Date  . BACK SURGERY     PLIF  . BICEPS TENDON REPAIR Right   . COLONOSCOPY WITH PROPOFOL N/A 05/31/2019   Procedure: COLONOSCOPY WITH PROPOFOL;  Surgeon: Toledo, Boykin Nearing, MD;  Location: [facility] ENDOSCOPY;  Service: Gastroenterology;  Laterality: N/A;  . HERNIA REPAIR    . MASTOIDECTOMY    . titanium rods in back    . WISDOM TOOTH EXTRACTION      History reviewed. No pertinent family history.  Allergies  Allergen Reactions  . Morphine Hypertension       Latest Ref Rng & Units 03/19/2017    3:58 PM 03/17/2017    3:40 PM  CBC  WBC 4.0 - 10.5 K/uL  11.6   Hemoglobin 13.0 - 17.0 g/dL 29.5  62.1   Hematocrit 39.0 - 52.0 % 45.0  51.9   Platelets 150 - 400 K/uL  204       CMP     Component Value Date/Time   NA 141 03/19/2017 1558   K 4.2 03/19/2017 1558   CL 106 03/17/2017 1540   CO2 22 03/17/2017 1540   GLUCOSE 121 (H) 03/19/2017 1558   BUN 9 03/17/2017 1540   CREATININE 1.08 03/17/2017 1540   CALCIUM 9.3 03/17/2017 1540   GFRNONAA >60 03/17/2017 1540     VAS [place] ABI WITH/WO TBI  Result Date: 10/29/2022  LOWER EXTREMITY DOPPLER STUDY Patient Name:  ANGELJESUS SYMONDS  Date of Exam:   10/29/2022 Medical Rec #: [id number]      Accession #:    [id number] Date of Birth: [birth year]       Patient Gender: M Patient Age:   74 years Exam Location:  [facility] Procedure:      VAS [place] ABI WITH/WO TBI Referring Phys: --------------------------------------------------------------------------------  Indications: Rest pain.  Performing Technologist: Salvadore Farber RVT  Examination Guidelines: A complete evaluation includes at minimum, Doppler waveform signals and systolic blood pressure reading at the  level of bilateral brachial, anterior tibial, and posterior tibial arteries, when vessel segments are accessible. Bilateral testing is considered an integral part of a complete examination. Photoelectric Plethysmograph (PPG) waveforms and toe systolic pressure readings are included as required and additional duplex testing as needed. Limited examinations for reoccurring indications may be performed as noted.  ABI Findings: +---------+------------------+-----+---------+--------+ Right    Rt Pressure (mmHg)IndexWaveform Comment  +---------+------------------+-----+---------+--------+ Brachial 140                                      +---------+------------------+-----+---------+--------+ ATA      153               1.06 triphasic         +---------+------------------+-----+---------+--------+ PTA      160               1.10 triphasic         +---------+------------------+-----+---------+--------+ Great Toe182               1.26 Normal            +---------+------------------+-----+---------+--------+ +---------+------------------+-----+---------+-------+ Left     Lt Pressure (mmHg)IndexWaveform Comment +---------+------------------+-----+---------+-------+ Brachial 145                                     +---------+------------------+-----+---------+-------+ ATA      168               1.16 triphasic        +---------+------------------+-----+---------+-------+ PTA      163               1.12 triphasic        +---------+------------------+-----+---------+-------+ Great Toe159               1.10 Normal           +---------+------------------+-----+---------+-------+  Summary: Right: Resting right ankle-brachial index is within normal range. The right toe-brachial index is normal. Left: Resting left ankle-brachial index is within normal range. The left toe-brachial index is normal. *See table(s) above for measurements and observations.  Electronically signed by  Levora Dredge MD on 10/29/2022 at 6:00:51 PM.    Final  Assessment & Plan:   1. Varicose veins of both lower extremities with pain ***  2. Primary hypertension ***   Current Outpatient Medications on File Prior to Visit  Medication Sig Dispense Refill  . fexofenadine-pseudoephedrine (ALLEGRA-D) 60-120 MG 12 hr tablet Take 1 tablet by mouth 2 (two) times daily.    . fluticasone (FLONASE) 50 MCG/ACT nasal spray Place 2 sprays into both nostrils daily.  11  . ibuprofen (ADVIL,MOTRIN) 200 MG tablet Take 800 mg by mouth every 6 (six) hours as needed (for pain.).    Marland Kitchen losartan (COZAAR) 100 MG tablet Take 100 mg by mouth daily.  3  . magnesium oxide (MAG-OX) 400 MG tablet Take 400 mg by mouth daily.    . metoprolol succinate (TOPROL-XL) 50 MG 24 hr tablet Take 50 mg by mouth daily. 3 times daily  3  . Multiple Vitamin (MULTIVITAMIN WITH MINERALS) TABS tablet Take 1 tablet by mouth daily. Men's One A Day Multivitamin    . NEOMYCIN-POLYMYXIN-HYDROCORTISONE (CORTISPORIN) 1 % SOLN OTIC solution 3 drops.    . tamsulosin (FLOMAX) 0.4 MG CAPS capsule Take 0.4 mg by mouth daily.     No current facility-administered medications on file prior to visit.    There are no Patient Instructions on file for this visit. No follow-ups on file.   Georgiana Spinner, NP

## 2022-12-06 NOTE — Progress Notes (Signed)
Subjective:    Patient ID: Andrew Rojas, male    DOB: 05-05-1970, 52 y.o.   MRN: [id number] Chief Complaint  Patient presents with   Follow-up    pt conv BIL reflux    Andrew Rojas is a 52 year old male who presents today as a referral from Dr. Judithann Sheen in regards to having cramping in his thighs and calves.  These are somewhat severe and they occur randomly in the middle of the night.  He notes that the right leg is worse than the left.  He has had a history of 2 back surgeries.  He also endorses having some discoloration of his feet in the wintertime with him returning purple.  He notes that he has been told at times he is also have neuropathy.  Leg cramping is especially concerning for him given his family history.  His mother had lymphoma and one of her symptoms was walking as well.   Previously, patient has ABI of 1.10 on the right and 1.16 on the left.  He has triphasic tibial artery waveforms normal toe waveforms bilaterally.  Currently the patient has no evidence of DVT or superficial thrombophlebitis bilaterally.  No evidence of deep venous insufficiency in the right with a small amount of it in the left.  He has superficial venous reflux noted in the bilateral great saphenous veins.    Review of Systems  Cardiovascular:        Leg cramps  All other systems reviewed and are negative.      Objective:   Physical Exam Vitals reviewed.  HENT:     Head: Normocephalic.  Cardiovascular:     Rate and Rhythm: Normal rate.     Pulses: Normal pulses.  Pulmonary:     Effort: Pulmonary effort is normal.  Skin:    General: Skin is warm and dry.  Neurological:     Mental Status: He is alert and oriented to person, place, and time.  Psychiatric:        Mood and Affect: Mood normal.        Behavior: Behavior normal.        Thought Content: Thought content normal.        Judgment: Judgment normal.     BP 127/82 (BP Location: Right Arm)   Pulse 92   Resp 18   Ht 5\' 11"  (1.803  m)   Wt (!) 300 lb 6.4 oz (136.3 kg)   BMI 41.90 kg/m   Past Medical History:  Diagnosis Date   Arthritis    back   Dysrhythmia    GERD (gastroesophageal reflux disease)    Hemorrhoids    Hyperlipidemia    Hypertension    Low testosterone    Lumbar stenosis    Non-cardiac chest pain    PONV (postoperative nausea and vomiting)    Sleep apnea    wears CPAP    Social History   Socioeconomic History   Marital status: Married    Spouse name: Not on file   Number of children: Not on file   Years of education: Not on file   Highest education level: Not on file  Occupational History   Not on file  Tobacco Use   Smoking status: Never   Smokeless tobacco: Former  Advertising account planner   Vaping status: Never Used  Substance and Sexual Activity   Alcohol use: Yes    Comment: rarely   Drug use: No   Sexual activity: Not on file  Other  Topics Concern   Not on file  Social History Narrative   Not on file   Social Determinants of Health   Financial Resource Strain: Low Risk  (09/18/2022)   Received from [facility]   Overall Financial Resource Strain (CARDIA)    Difficulty of Paying Living Expenses: Not hard at all  Food Insecurity: No Food Insecurity (09/18/2022)   Received from [facility]   Hunger Vital Sign    Worried About Running Out of Food in the Last Year: Never true    Ran Out of Food in the Last Year: Never true  Transportation Needs: No Transportation Needs (09/18/2022)   Received from [facility] - Transportation    In the past 12 months, has lack of transportation kept you from medical appointments or from getting medications?: No    Lack of Transportation (Non-Medical): No  Physical Activity: Not on file  Stress: Not on file  Social Connections: Not on file  Intimate Partner Violence: Not on file    Past Surgical History:  Procedure Laterality Date   BACK SURGERY     PLIF   BICEPS TENDON REPAIR  Right    COLONOSCOPY WITH PROPOFOL N/A 05/31/2019   Procedure: COLONOSCOPY WITH PROPOFOL;  Surgeon: Toledo, Boykin Nearing, MD;  Location: [facility] ENDOSCOPY;  Service: Gastroenterology;  Laterality: N/A;   HERNIA REPAIR     MASTOIDECTOMY     titanium rods in back     WISDOM TOOTH EXTRACTION      History reviewed. No pertinent family history.  Allergies  Allergen Reactions   Morphine Hypertension       Latest Ref Rng & Units 03/19/2017    3:58 PM 03/17/2017    3:40 PM  CBC  WBC 4.0 - 10.5 K/uL  11.6   Hemoglobin 13.0 - 17.0 g/dL 82.9  56.2   Hematocrit 39.0 - 52.0 % 45.0  51.9   Platelets 150 - 400 K/uL  204       CMP     Component Value Date/Time   NA 141 03/19/2017 1558   K 4.2 03/19/2017 1558   CL 106 03/17/2017 1540   CO2 22 03/17/2017 1540   GLUCOSE 121 (H) 03/19/2017 1558   BUN 9 03/17/2017 1540   CREATININE 1.08 03/17/2017 1540   CALCIUM 9.3 03/17/2017 1540   GFRNONAA >60 03/17/2017 1540     VAS [place] ABI WITH/WO TBI  Result Date: 10/29/2022  LOWER EXTREMITY DOPPLER STUDY Patient Name:  Andrew Rojas  Date of Exam:   10/29/2022 Medical Rec #: [id number]      Accession #:    [id number] Date of Birth: 03/27/1970       Patient Gender: M Patient Age:   49 years Exam Location:  [facility] Procedure:      VAS [place] ABI WITH/WO TBI Referring Phys: --------------------------------------------------------------------------------  Indications: Rest pain.  Performing Technologist: Salvadore Farber RVT  Examination Guidelines: A complete evaluation includes at minimum, Doppler waveform signals and systolic blood pressure reading at the level of bilateral brachial, anterior tibial, and posterior tibial arteries, when vessel segments are accessible. Bilateral testing is considered an integral part of a complete examination. Photoelectric Plethysmograph (PPG) waveforms and toe systolic pressure readings are included as required and additional duplex testing as needed. Limited examinations  for reoccurring indications may be performed as noted.  ABI Findings: +---------+------------------+-----+---------+--------+ Right    Rt Pressure (mmHg)IndexWaveform Comment  +---------+------------------+-----+---------+--------+ Brachial 140                                      +---------+------------------+-----+---------+--------+  ATA      153               1.06 triphasic         +---------+------------------+-----+---------+--------+ PTA      160               1.10 triphasic         +---------+------------------+-----+---------+--------+ Great Toe182               1.26 Normal            +---------+------------------+-----+---------+--------+ +---------+------------------+-----+---------+-------+ Left     Lt Pressure (mmHg)IndexWaveform Comment +---------+------------------+-----+---------+-------+ Brachial 145                                     +---------+------------------+-----+---------+-------+ ATA      168               1.16 triphasic        +---------+------------------+-----+---------+-------+ PTA      163               1.12 triphasic        +---------+------------------+-----+---------+-------+ Great Toe159               1.10 Normal           +---------+------------------+-----+---------+-------+  Summary: Right: Resting right ankle-brachial index is within normal range. The right toe-brachial index is normal. Left: Resting left ankle-brachial index is within normal range. The left toe-brachial index is normal. *See table(s) above for measurements and observations.  Electronically signed by Levora Dredge MD on 10/29/2022 at 6:00:51 PM.    Final        Assessment & Plan:   1. Varicose veins of both lower extremities with pain Today the patient's noninvasive studies show that he did have significant evidence of venous reflux noted in his bilateral lower extremities.  There is certainly possible that with this insufficiency that is the  cause of the cramping that he is having in his lower extremities.  However, he is not truly worn any medical grade compression stockings for any longstanding time frames.  Because of this I think it would be helpful to utilize medical grade compression to see if this causes improvement in his symptoms.  We had a very long discussion as to what venous insufficiency is as well as the course of treatment options such as conservative therapy versus endovenous laser ablation.  Will continue with conservative therapy until his follow-up.  We also discussed that at this time his symptoms are not life or limb threatening.  2. Primary hypertension Continue antihypertensive medications as already ordered, these medications have been reviewed and there are no changes at this time.   Current Outpatient Medications on File Prior to Visit  Medication Sig Dispense Refill   fexofenadine-pseudoephedrine (ALLEGRA-D) 60-120 MG 12 hr tablet Take 1 tablet by mouth 2 (two) times daily.     fluticasone (FLONASE) 50 MCG/ACT nasal spray Place 2 sprays into both nostrils daily.  11   ibuprofen (ADVIL,MOTRIN) 200 MG tablet Take 800 mg by mouth every 6 (six) hours as needed (for pain.).     losartan (COZAAR) 100 MG tablet Take 100 mg by mouth daily.  3   magnesium oxide (MAG-OX) 400 MG tablet Take 400 mg by mouth daily.     metoprolol succinate (TOPROL-XL) 50 MG 24 hr tablet Take 50 mg by mouth daily. 3 times daily  3   Multiple Vitamin (MULTIVITAMIN WITH MINERALS) TABS tablet Take 1 tablet by mouth daily. Men's One A Day Multivitamin     NEOMYCIN-POLYMYXIN-HYDROCORTISONE (CORTISPORIN) 1 % SOLN OTIC solution 3 drops.     tamsulosin (FLOMAX) 0.4 MG CAPS capsule Take 0.4 mg by mouth daily.     No current facility-administered medications on file prior to visit.    There are no Patient Instructions on file for this visit. No follow-ups on file.   Georgiana Spinner, NP

## 2023-01-14 ENCOUNTER — Ambulatory Visit (INDEPENDENT_AMBULATORY_CARE_PROVIDER_SITE_OTHER): Payer: 59 | Admitting: Nurse Practitioner

## 2023-01-14 ENCOUNTER — Encounter (INDEPENDENT_AMBULATORY_CARE_PROVIDER_SITE_OTHER): Payer: Self-pay | Admitting: Nurse Practitioner

## 2023-01-14 VITALS — BP 113/75 | HR 72 | Resp 16 | Wt 295.8 lb

## 2023-01-14 DIAGNOSIS — I10 Essential (primary) hypertension: Secondary | ICD-10-CM

## 2023-01-14 DIAGNOSIS — I83813 Varicose veins of bilateral lower extremities with pain: Secondary | ICD-10-CM | POA: Diagnosis not present

## 2023-01-15 ENCOUNTER — Encounter (INDEPENDENT_AMBULATORY_CARE_PROVIDER_SITE_OTHER): Payer: Self-pay | Admitting: Nurse Practitioner

## 2023-01-15 NOTE — Progress Notes (Signed)
 Subjective:    Patient ID: Andrew Rojas, male    DOB: [birth year], 53 y.o.   MRN: [id number] Chief Complaint  Patient presents with   Follow-up    Discuss laser    Andrew Rojas is a 53 year old male who presents today as a referral from Dr. Auston in regards to having cramping in his thighs and calves.  The patient was found to have significant venous reflux in his bilateral great saphenous veins.  In evaluation of his arteries he had normal ABIs bilaterally with strong triphasic waveforms.  Since his last visit he has been diligent with use of medical grade compression stockings and elevation.  He is also began on semaglutide and has lost approximately 5 pounds.  He notes that the cramping that he originally described which was severe and occurred randomly in the middle of the night, has not returned.  Overall he notes that his legs have began to feel much better since beginning conservative therapy.      Review of Systems  Cardiovascular:  Negative for leg swelling.  All other systems reviewed and are negative.      Objective:   Physical Exam Vitals reviewed.  HENT:     Head: Normocephalic.  Cardiovascular:     Rate and Rhythm: Normal rate.  Pulmonary:     Effort: Pulmonary effort is normal.  Skin:    General: Skin is warm and dry.  Neurological:     Mental Status: He is alert and oriented to person, place, and time.  Psychiatric:        Mood and Affect: Mood normal.        Behavior: Behavior normal.        Thought Content: Thought content normal.        Judgment: Judgment normal.     BP 113/75   Pulse 72   Resp 16   Wt 295 lb 12.8 oz (134.2 kg)   BMI 41.26 kg/m   Past Medical History:  Diagnosis Date   Arthritis    back   Dysrhythmia    GERD (gastroesophageal reflux disease)    Hemorrhoids    Hyperlipidemia    Hypertension    Low testosterone    Lumbar stenosis    Non-cardiac chest pain    PONV (postoperative nausea and vomiting)    Sleep apnea     wears CPAP    Social History   Socioeconomic History   Marital status: Married    Spouse name: Not on file   Number of children: Not on file   Years of education: Not on file   Highest education level: Not on file  Occupational History   Not on file  Tobacco Use   Smoking status: Never   Smokeless tobacco: Former  Advertising Account Planner   Vaping status: Never Used  Substance and Sexual Activity   Alcohol use: Yes    Comment: rarely   Drug use: No   Sexual activity: Not on file  Other Topics Concern   Not on file  Social History Narrative   Not on file   Social Drivers of Health   Financial Resource Strain: Low Risk  (09/18/2022)   Received from [facility]   Overall Financial Resource Strain (CARDIA)    Difficulty of Paying Living Expenses: Not hard at all  Food Insecurity: No Food Insecurity (09/18/2022)   Received from [facility]   Hunger Vital Sign    Worried About Running Out of  Food in the Last Year: Never true    Ran Out of Food in the Last Year: Never true  Transportation Needs: No Transportation Needs (09/18/2022)   Received from [facility] - Transportation    In the past 12 months, has lack of transportation kept you from medical appointments or from getting medications?: No    Lack of Transportation (Non-Medical): No  Physical Activity: Not on file  Stress: Not on file  Social Connections: Not on file  Intimate Partner Violence: Not on file    Past Surgical History:  Procedure Laterality Date   BACK SURGERY     PLIF   BICEPS TENDON REPAIR Right    COLONOSCOPY WITH PROPOFOL  N/A 05/31/2019   Procedure: COLONOSCOPY WITH PROPOFOL ;  Surgeon: Toledo, Ladell POUR, MD;  Location: [facility] ENDOSCOPY;  Service: Gastroenterology;  Laterality: N/A;   HERNIA REPAIR     MASTOIDECTOMY     titanium rods in back     WISDOM TOOTH EXTRACTION      History reviewed. No pertinent family history.  Allergies  Allergen  Reactions   Morphine Hypertension       Latest Ref Rng & Units 03/19/2017    3:58 PM 03/17/2017    3:40 PM  CBC  WBC 4.0 - 10.5 K/uL  11.6   Hemoglobin 13.0 - 17.0 g/dL 84.6  81.6   Hematocrit 39.0 - 52.0 % 45.0  51.9   Platelets 150 - 400 K/uL  204       CMP     Component Value Date/Time   NA 141 03/19/2017 1558   K 4.2 03/19/2017 1558   CL 106 03/17/2017 1540   CO2 22 03/17/2017 1540   GLUCOSE 121 (H) 03/19/2017 1558   BUN 9 03/17/2017 1540   CREATININE 1.08 03/17/2017 1540   CALCIUM 9.3 03/17/2017 1540   GFRNONAA >60 03/17/2017 1540     No results found.     Assessment & Plan:   1. Varicose veins of both lower extremities with pain (Primary) Currently conservative therapy seems to be working well for the patient.  Since he has been engaging in conservative therapy the cramping has stopped and the feeling of legs is improved.  He currently plans to continue with use of medical grade compression and other conservative therapies.  In addition he has recently been started on semaglutide which should hopefully promote some weight loss.  I have discussed with the patient that in some instances we have seen improvement in patient's reflux with weight loss and good adherence to conservative therapies.  Will have her return in 6 months and we will repeat his noninvasive studies and evaluate if continue with conservative therapy would be his best option or we will move forward with intervention, such as endovenous laser ablation.  2. Primary hypertension Continue antihypertensive medications as already ordered, these medications have been reviewed and there are no changes at this time.   Current Outpatient Medications on File Prior to Visit  Medication Sig Dispense Refill   fexofenadine-pseudoephedrine  (ALLEGRA-D) 60-120 MG 12 hr tablet Take 1 tablet by mouth 2 (two) times daily.     fluticasone  (FLONASE ) 50 MCG/ACT nasal spray Place 2 sprays into both nostrils daily.  11    ibuprofen  (ADVIL ,MOTRIN ) 200 MG tablet Take 800 mg by mouth every 6 (six) hours as needed (for pain.).     losartan  (COZAAR ) 100 MG tablet Take 100 mg by mouth daily.  3   magnesium oxide (MAG-OX) 400  MG tablet Take 400 mg by mouth daily.     metoprolol  succinate (TOPROL -XL) 50 MG 24 hr tablet Take 50 mg by mouth daily. 3 times daily  3   Multiple Vitamin (MULTIVITAMIN WITH MINERALS) TABS tablet Take 1 tablet by mouth daily. Men's One A Day Multivitamin     NEOMYCIN-POLYMYXIN-HYDROCORTISONE (CORTISPORIN) 1 % SOLN OTIC solution 3 drops.     Semaglutide-Weight Management 0.25 MG/0.5ML SOAJ Inject 0.25 mg into the skin once a week.     tamsulosin (FLOMAX) 0.4 MG CAPS capsule Take 0.4 mg by mouth daily.     No current facility-administered medications on file prior to visit.    There are no Patient Instructions on file for this visit. No follow-ups on file.   Sladen Plancarte E Quaniya Damas, NP

## 2023-02-15 ENCOUNTER — Other Ambulatory Visit: Payer: Self-pay | Admitting: Orthopedic Surgery

## 2023-02-15 DIAGNOSIS — M25511 Pain in right shoulder: Secondary | ICD-10-CM

## 2023-02-20 ENCOUNTER — Ambulatory Visit
Admission: RE | Admit: 2023-02-20 | Discharge: 2023-02-20 | Disposition: A | Discharge status: Home / Self Care | Payer: 59 | Source: Ambulatory Visit | Attending: Orthopedic Surgery | Admitting: Orthopedic Surgery

## 2023-02-20 DIAGNOSIS — M25511 Pain in right shoulder: Secondary | ICD-10-CM

## 2023-06-01 ENCOUNTER — Encounter (INDEPENDENT_AMBULATORY_CARE_PROVIDER_SITE_OTHER): Payer: Self-pay

## 2023-07-22 ENCOUNTER — Other Ambulatory Visit (INDEPENDENT_AMBULATORY_CARE_PROVIDER_SITE_OTHER): Payer: Self-pay | Admitting: Nurse Practitioner

## 2023-07-22 DIAGNOSIS — I83813 Varicose veins of bilateral lower extremities with pain: Secondary | ICD-10-CM

## 2023-07-26 ENCOUNTER — Ambulatory Visit (INDEPENDENT_AMBULATORY_CARE_PROVIDER_SITE_OTHER): Payer: 59 | Admitting: Vascular Surgery

## 2023-07-26 ENCOUNTER — Encounter (INDEPENDENT_AMBULATORY_CARE_PROVIDER_SITE_OTHER): Payer: Self-pay

## 2023-07-26 ENCOUNTER — Encounter (INDEPENDENT_AMBULATORY_CARE_PROVIDER_SITE_OTHER): Payer: Self-pay | Admitting: Vascular Surgery

## 2023-07-26 ENCOUNTER — Ambulatory Visit (INDEPENDENT_AMBULATORY_CARE_PROVIDER_SITE_OTHER): Payer: 59

## 2023-07-26 VITALS — BP 128/80 | HR 76 | Resp 18 | Ht 71.0 in | Wt 282.0 lb

## 2023-07-26 DIAGNOSIS — I8310 Varicose veins of unspecified lower extremity with inflammation: Secondary | ICD-10-CM | POA: Insufficient documentation

## 2023-07-26 DIAGNOSIS — E782 Mixed hyperlipidemia: Secondary | ICD-10-CM | POA: Diagnosis not present

## 2023-07-26 DIAGNOSIS — I872 Venous insufficiency (chronic) (peripheral): Secondary | ICD-10-CM

## 2023-07-26 DIAGNOSIS — I10 Essential (primary) hypertension: Secondary | ICD-10-CM

## 2023-07-26 NOTE — Progress Notes (Signed)
 MRN : [id number]  Andrew Rojas is a 53 y.o. (1970/04/01) male who presents with chief complaint of varicose veins hurt.  History of Present Illness:     No outpatient medications have been marked as taking for the 07/26/23 encounter (Appointment) with Jama, Cordella MATSU, MD.    Past Medical History:  Diagnosis Date   Arthritis    back   Dysrhythmia    GERD (gastroesophageal reflux disease)    Hemorrhoids    Hyperlipidemia    Hypertension    Low testosterone    Lumbar stenosis    Non-cardiac chest pain    PONV (postoperative nausea and vomiting)    Sleep apnea    wears CPAP    Past Surgical History:  Procedure Laterality Date   BACK SURGERY     PLIF   BICEPS TENDON REPAIR Right    COLONOSCOPY WITH PROPOFOL  N/A 05/31/2019   Procedure: COLONOSCOPY WITH PROPOFOL ;  Surgeon: Toledo, Ladell POUR, MD;  Location: [facility] ENDOSCOPY;  Service: Gastroenterology;  Laterality: N/A;   HERNIA REPAIR     MASTOIDECTOMY     titanium rods in back     WISDOM TOOTH EXTRACTION      Social History Social History   Tobacco Use   Smoking status: Never   Smokeless tobacco: Former  Building services engineer status: Never Used  Substance Use Topics   Alcohol use: Yes    Comment: rarely   Drug use: No    Family History No family history on file.  Allergies  Allergen Reactions   Morphine Hypertension     REVIEW OF SYSTEMS (Negative unless checked)  Constitutional: [] Weight loss  [] Fever  [] Chills Cardiac: [] Chest pain   [] Chest pressure   [] Palpitations   [] Shortness of breath when laying flat   [] Shortness of breath with exertion. Vascular:  [] Pain in legs with walking   [x] Pain in legs with standing  [] History of DVT   [] Phlebitis   [] Swelling in legs   [x] Varicose veins   [] Non-healing ulcers Pulmonary:   [] Uses home oxygen   [] Productive cough   [] Hemoptysis   [] Wheeze  [] COPD   [] Asthma Neurologic:  [] Dizziness   [] Seizures   [] History of stroke   [] History of TIA   [] Aphasia   [] Vissual changes   [] Weakness or numbness in arm   [] Weakness or numbness in leg Musculoskeletal:   [] Joint swelling   [] Joint pain   [] Low back pain Hematologic:  [] Easy bruising  [] Easy bleeding   [] Hypercoagulable state   [] Anemic Gastrointestinal:  [] Diarrhea   [] Vomiting  [] Gastroesophageal reflux/heartburn   [] Difficulty swallowing. Genitourinary:  [] Chronic kidney disease   [] Difficult urination  [] Frequent urination   [] Blood in urine Skin:  [] Rashes   [] Ulcers  Psychological:  [] History of anxiety   []  History of major depression.  Physical Examination  There were no vitals filed for this visit. There is no height or weight on file to calculate BMI. Gen: WD/WN, NAD Head: [place]/AT, No temporalis wasting.  Ear/Nose/Throat: Hearing grossly intact, nares w/o erythema or drainage, pinna without lesions Eyes: PER, EOMI, sclera nonicteric.  Neck: Supple, no gross masses.  No JVD.  Pulmonary:  Good air movement, no audible wheezing, no use of accessory muscles.  Cardiac: RRR, precordium not hyperdynamic. Vascular:  Large varicosities present, greater than 10 mm bilaterally.  Veins are tender to palpation  Mild venous stasis changes to the legs bilaterally.  Trace soft pitting edema CEAP [id number] Vessel Right Left  Radial Palpable Palpable  Gastrointestinal: soft, non-distended. No guarding/no peritoneal signs.  Musculoskeletal: M/S 5/5 throughout.  No deformity.  Neurologic: CN 2-12 intact. Pain and light touch intact in extremities.  Symmetrical.  Speech is fluent. Motor exam as listed above. Psychiatric: Judgment intact, Mood & affect appropriate for pt's clinical situation. Dermatologic: Venous rashes no ulcers noted.  No changes consistent with cellulitis. Lymph : No lichenification or skin changes of chronic lymphedema.  CBC Lab Results  Component Value Date   WBC 11.6 (H) 03/17/2017   HGB 15.3 03/19/2017   HCT 45.0 03/19/2017   MCV 93.3 03/17/2017   PLT 204  03/17/2017    BMET    Component Value Date/Time   NA 141 03/19/2017 1558   K 4.2 03/19/2017 1558   CL 106 03/17/2017 1540   CO2 22 03/17/2017 1540   GLUCOSE 121 (H) 03/19/2017 1558   BUN 9 03/17/2017 1540   CREATININE 1.08 03/17/2017 1540   CALCIUM 9.3 03/17/2017 1540   GFRNONAA >60 03/17/2017 1540   GFRAA >60 03/17/2017 1540   CrCl cannot be calculated (Patient's most recent lab result is older than the maximum 21 days allowed.).  COAG No results found for: INR, PROTIME  Radiology No results found.   Assessment/Plan 1. Varicose veins with inflammation (Primary) Recommend:  The patient is complaining of varicose veins.    I have had a long discussion with the patient regarding  varicose veins and why they cause symptoms.  Patient will continue wearing graduated compression stockings on a daily basis, beginning first thing in the morning and removing them in the evening. The patient is instructed specifically not to sleep in the stockings.    The patient  will also begin using over-the-counter analgesics such as Motrin  600 mg po TID to help control the symptoms as needed.    In addition, behavioral modification including elevation during the day will be initiated, utilizing a recliner was recommended.  The patient is also instructed to continue exercising such as walking 4-5 times per week.  At this time the patient wishes to continue conservative therapy and is not interested in more invasive treatments such as laser ablation and sclerotherapy.  The Patient will follow up PRN if the symptoms worsen.   A total of 30 minutes was spent with this patient and greater than 50% was spent in counseling and coordination of care with the patient.  Discussion included the treatment options for vascular disease including indications for surgery and intervention.  Also discussed is the appropriate timing of treatment.  In addition medical therapy was discussed.   2. Chronic venous  insufficiency Recommend:  The patient is complaining of varicose veins.    I have had a long discussion with the patient regarding  varicose veins and why they cause symptoms.  Patient will continue wearing graduated compression stockings on a daily basis, beginning first thing in the morning and removing them in the evening. The patient is instructed specifically not to sleep in the stockings.    The patient  will also begin using over-the-counter analgesics such as Motrin  600 mg po TID to help control the symptoms as needed.    In addition, behavioral modification including elevation during the day will be initiated, utilizing a recliner was recommended.  The patient is also instructed to continue exercising such as walking 4-5 times per week.  At this time the patient wishes to continue conservative therapy and is  not interested in more invasive treatments such as laser ablation and sclerotherapy.  The Patient will follow up PRN if the symptoms worsen.   A total of 30 minutes was spent with this patient and greater than 50% was spent in counseling and coordination of care with the patient.  Discussion included the treatment options for vascular disease including indications for surgery and intervention.  Also discussed is the appropriate timing of treatment.  In addition medical therapy was discussed.   3. Primary hypertension Continue antihypertensive medications as already ordered, these medications have been reviewed and there are no changes at this time.  4. Mixed hyperlipidemia Continue statin as ordered and reviewed, no changes at this time   Cordella Shawl, MD  07/26/2023 8:50 AM

## 2023-07-31 ENCOUNTER — Encounter (INDEPENDENT_AMBULATORY_CARE_PROVIDER_SITE_OTHER): Payer: Self-pay | Admitting: Vascular Surgery
